# Patient Record
Sex: Female | Born: 1980 | Race: White | Hispanic: No | Marital: Married | Smoking: Never smoker
Health system: Southern US, Community
[De-identification: ages and names within clinical notes are randomized; demographics above are authoritative.]

## PROBLEM LIST (undated history)

## (undated) DIAGNOSIS — Z789 Other specified health status: Secondary | ICD-10-CM

## (undated) DIAGNOSIS — N83209 Unspecified ovarian cyst, unspecified side: Secondary | ICD-10-CM

## (undated) HISTORY — PX: APPENDECTOMY: SHX54

---

## 2007-02-19 ENCOUNTER — Ambulatory Visit (HOSPITAL_COMMUNITY): Admission: RE | Admit: 2007-02-19 | Discharge: 2007-02-19 | Payer: Self-pay | Admitting: Obstetrics & Gynecology

## 2007-03-04 ENCOUNTER — Ambulatory Visit (HOSPITAL_COMMUNITY): Admission: RE | Admit: 2007-03-04 | Discharge: 2007-03-04 | Payer: Self-pay | Admitting: Obstetrics and Gynecology

## 2007-08-08 ENCOUNTER — Inpatient Hospital Stay (HOSPITAL_COMMUNITY): Admission: AD | Admit: 2007-08-08 | Discharge: 2007-08-12 | Payer: Self-pay | Admitting: Obstetrics & Gynecology

## 2007-08-08 ENCOUNTER — Ambulatory Visit: Payer: Self-pay | Admitting: Obstetrics & Gynecology

## 2007-08-09 ENCOUNTER — Encounter: Payer: Self-pay | Admitting: Obstetrics & Gynecology

## 2008-02-11 ENCOUNTER — Emergency Department (HOSPITAL_COMMUNITY): Admission: EM | Admit: 2008-02-11 | Discharge: 2008-02-11 | Payer: Self-pay | Admitting: Emergency Medicine

## 2008-02-14 ENCOUNTER — Emergency Department (HOSPITAL_COMMUNITY): Admission: EM | Admit: 2008-02-14 | Discharge: 2008-02-14 | Payer: Self-pay | Admitting: Emergency Medicine

## 2008-02-23 ENCOUNTER — Emergency Department (HOSPITAL_COMMUNITY): Admission: EM | Admit: 2008-02-23 | Discharge: 2008-02-24 | Payer: Self-pay | Admitting: Emergency Medicine

## 2008-03-11 ENCOUNTER — Ambulatory Visit: Payer: Self-pay | Admitting: Obstetrics & Gynecology

## 2008-03-25 ENCOUNTER — Ambulatory Visit (HOSPITAL_COMMUNITY): Admission: RE | Admit: 2008-03-25 | Discharge: 2008-03-25 | Payer: Self-pay | Admitting: Obstetrics and Gynecology

## 2008-04-15 ENCOUNTER — Ambulatory Visit: Payer: Self-pay | Admitting: Obstetrics and Gynecology

## 2010-10-19 ENCOUNTER — Emergency Department (HOSPITAL_COMMUNITY)
Admission: EM | Admit: 2010-10-19 | Discharge: 2010-10-19 | Payer: Self-pay | Source: Home / Self Care | Admitting: Emergency Medicine

## 2010-11-19 ENCOUNTER — Encounter: Payer: Self-pay | Admitting: *Deleted

## 2011-03-13 NOTE — Op Note (Signed)
NAME:  Destiny Barnes, Destiny Barnes NO.:  [id number]   MEDICAL RECORD NO.:  [id number]          PATIENT TYPE:  INP   LOCATION:  9115                          [facility]   PHYSICIAN:  Lesly Dukes, M.D. DATE OF BIRTH:  [birth year]   DATE OF PROCEDURE:  08/08/2007  DATE OF DISCHARGE:                               OPERATIVE REPORT   PREOPERATIVE DIAGNOSIS:  A 30 year old para 0 female at term with  premature rupture of membranes with failure to progress past the  anterior lip.  Occiput posterior presentation, asynclitism,  chorioamnionitis, and severe variables, unable to tolerate Pitocin.   POSTOPERATIVE DIAGNOSIS:  35. A 30 year old para 0 female at term with premature rupture of      membranes with failure to progress past the anterior lip.  Occiput      posterior presentation, asynclitism, chorioamnionitis, and severe      variables, unable to tolerate Pitocin.  2. Left paratubal cyst.   PROCEDURE:  Primary low transverse cesarean section.   SURGEON:  Lesly Dukes, M.D.   ASSISTANT:  Marisue Ivan, MD   ANESTHESIA:  Epidural.   PATHOLOGY:  Placenta.   ESTIMATED BLOOD LOSS:  1000.   COMPLICATIONS:  None.   FINDINGS:  Viable female infant.  Apgars 9 and 9.  Vertex presentation.  Weight 8 pounds, 8 ounces.  Thin meconium.  Normal uterus and ovaries.  There is a left paratubal cyst that is not pedunculated and has  excessive vascularity that would not be easily removed at the time of  the cesarean section due to the swollen adnexa.   PROCEDURE:  After informed consent was obtained, the patient was taken  to the operating room, where epidural anesthesia was found to be  adequate.  The patient was placed in dorsal supine position with a  leftward tilt.  A Foley was already in the bladder.  A Pfannenstiel skin  incision was made at the scalpel and carried down to the fascia.  The  fascia was incised in the midline.  This incision was extended  bilaterally.   The superior and inferior aspect of the fascial incision  were grasped with Kocher clamps, tented up, and dissected off sharply  and bluntly from the underlying layers of rectus muscles.  The rectus  muscles were separated in the midline.  The peritoneum was entered  bluntly.  The bladder blade was inserted, and the bladder flap was  created.  The bladder blade was reinserted.  The uterine incision was  made in a transverse fashion in the lower uterine segment, and the head  delivered atraumatically.  The rest of the baby's body was delivered  easily, and the nose and mouth were suctioned.  The cord was clamped and  cut, and the baby was handed off to the waiting pediatrician.  Cord  blood was sent for type and screen.  The placenta was delivered manually  and had three vessel cord.  The uterus was cleared of all clots and  debris, and the uterine incision was closed with 0 Vicryl in a running  locked fashion.  A second suture of 0 Vicryl was used to reinforce the  incision.  As noted above, there was a left paratubal cyst that could  not be easily removed due to the small nature of the adnexa at this  point.  It is felt to be a very simple paratubal cyst, and the patient  was told about it and that we were not going to remove it.  The rectus  muscles and peritoneum were noted to be hemostatic.  The uterus is  hemostatic off tension.  The fascia was closed with 0 Vicryl in a  running fashion.  Good hemostasis was noted.  The subcutaneous tissues  were copiously irrigated and found to be hemostatic.  The skin was  closed with staples.  A pressure dressing was placed on the abdomen.  The patient tolerated the procedure well.  Sponge, lap, instrument, and  needle count were correct x2.  Patient went to the recovery room in  stable condition.      Lesly Dukes, M.D.  Electronically Signed     KHL/MEDQ  D:  08/09/2007  T:  08/09/2007  Job:  [id number]

## 2011-03-13 NOTE — Group Therapy Note (Signed)
NAMEDEANDRE, STANSEL NO.:  [id number]   MEDICAL RECORD NO.:  [id number]          PATIENT TYPE:  WOC   LOCATION:  [facility]                   FACILITY:  [facility]   PHYSICIAN:  Allie Bossier, MD        DATE OF BIRTH:  October 04, 1981   DATE OF SERVICE:  03/11/2008                                  CLINIC NOTE   Ms. Blankenbaker is a 30 year old married [place] gravida 1, para 1 who has a  95-month-old son delivered by C-section.  She was seen in the [facility]  emergency department for pelvic pain/right lower quadrant pain on February 11, 2008.  They did a CT scan that was that showed something and could  not exclude a hydrosalpinx.  Transvaginal sonogram was then done that  showed 2 right ovarian cysts.  They recommended a follow-up ultrasound  in 6 weeks.  Ultrasound done 6 weeks later shows a left ovarian cyst  that measures 1.6 x 2.2 cm and a right ovarian cyst that measures 3.3 x  4 cm.  These are both simple cysts.  Please note that measurements were  not given on the ultrasound done in the [facility] ER, and that the  second ultrasound done was not done at the 6-week recommended follow-up  but instead at 2 weeks.  Ms. Spofford has been on Yasmin on a daily basis  to for birth control until this week when she forgot to take 2 pills in  a row, so she has discontinued the Yasmin, and plans to restart after  her period.  Of note, we have now discussed the recommended method  following if she misses only 2 pills.   On further review of systems, she does say that she has had dyspareunia  since her C-section 7 months ago.  When she describes her right lower  quadrant pain that initially brought her to the emergency room at [facility], she describes it as occurring approximately twice a week.  She  says that it will last for less than 5 minutes and that it has certainly  responded to the Vicodin that they gave her in the emergency room.  She  has not tried any nonsteroidals or  Tylenol.  Pain does not seem to be  affecting her lifestyle.   On physical exam, her cervix appears normal.  Her uterus is normal size  and shape, mid plane and nontender.  Her adnexa are slightly tender  bilaterally but not grossly enlarged.   ASSESSMENT/PLAN:  Small bilateral ovarian cyst.  I believe that these  are physiologic and are not particularly symptomatic.  I have does  cervical cultures to workup her dyspareunia.  Recommended she restart  her Yasmin and will check an ultrasound for follow-up of these cysts.      Allie Bossier, MD     MCD/MEDQ  D:  03/11/2008  T:  03/11/2008  Job:  [id number]

## 2011-03-16 NOTE — Discharge Summary (Signed)
Destiny Barnes, CHAIDEZ NO.:  [id number]   MEDICAL RECORD NO.:  [id number]          PATIENT TYPE:  INP   LOCATION:                                [facility]   PHYSICIAN:  Lesly Dukes, M.D. DATE OF BIRTH:  26-Apr-1981   DATE OF ADMISSION:  08/08/2007  DATE OF DISCHARGE:  08/12/2007                               DISCHARGE SUMMARY   ADMISSION DIAGNOSIS:  Intrauterine pregnancy.   DISCHARGE DIAGNOSIS:  Intrauterine pregnancy, status post primary low  transverse Cesarean section secondary to failure to progress.   PROCEDURE:  Primary low transverse C section secondary to failure to  progress.   HOSPITAL COURSE:  This is a 30 year old G1, P1 at 41 weeks that was  admitted for a spontaneous rupture of membranes on 08/07/07, at 10:00  a.m. that was admitted the following day after having pain with  contractions.  The patient was immediately taken to labor and delivery  where she was started on pitocin.  The patient therein started to have  increase in purulence and placed on Ampicillin and it was also noted  that patient's monitoring showed variable decels that was treated with  decreasing the pitocin and changing positions and applying oxygen which  improved the variables. After 2 1/2 hours of adequate contractions the  patient still had an anterior lip and it was thought that the patient's  baby's head felt OP and asynclitic, but the fetal heart tones were  currently reassuring so the patient was consented for a C section and  was taken to C section.  The patient was thought to possibly have  chorioamnionitis.  The patient was placed on Ampicillin as well as  gentamicin.   The patient went to the mother/baby floor after he procedure and has  been recovering well.  The patient has been afebrile and has been off  the antibiotics since being on the floor.  The patient is to be  discharged on 10/14, postop day #3 in good condition.   LABORATORY DATA:  CBC  showed initial white blood count on 08/08/07, of  12.6, hemoglobin of 11.2 and platelets of 226.  CBC on 08/10/07, had a  white blood cell count of 15.2, hemoglobin of 9.2, and platelets of 200.   DISCHARGE CONDITION:  Good.   DISPOSITION:  Discharged to home.   MEDICATIONS:  1. Ibuprofen 600 mg orally q. 6 hours p.r.n. for pain.  2. Colace 100 mg orally b.i.d. p.r.n. for constipation.  3. Iron sulfate 325 mg 1 tablet by mouth twice day as needed for      anemia.  4. Micronor 1 tablet by mouth once daily, preferably at the same time      of day.  5. Percocet 5/325 mg 1 tablet by mouth every 4 hours as needed for      pain.  6. Prenatal vitamins 1 tablet by mouth once daily.   INSTRUCTIONS:  The patient is to avoid heavy lifting.  Have a regular  diet.  Avoid anything in the vagina for 6 weeks and to follow up with  the [facility] Department in 6 weeks.      Marisue Ivan, MD      Lesly Dukes, M.D.  Electronically Signed    KL/MEDQ  D:  08/12/2007  T:  08/12/2007  Job:  [id number]

## 2011-07-24 LAB — URINALYSIS, ROUTINE W REFLEX MICROSCOPIC
Bilirubin Urine: NEGATIVE
Bilirubin Urine: NEGATIVE
Glucose, UA: NEGATIVE
Glucose, UA: NEGATIVE
Hgb urine dipstick: NEGATIVE
Hgb urine dipstick: NEGATIVE
Nitrite: NEGATIVE
Nitrite: NEGATIVE
Specific Gravity, Urine: 1.017
Urobilinogen, UA: 0.2
Urobilinogen, UA: 0.2
pH: 7.5

## 2011-07-24 LAB — PREGNANCY, URINE: Preg Test, Ur: NEGATIVE

## 2011-07-24 LAB — CBC
Hemoglobin: 12.8
MCHC: 34.7
MCV: 91.6
RBC: 4.02
RDW: 12.8
WBC: 7.7

## 2011-07-24 LAB — BASIC METABOLIC PANEL
Calcium: 9.3
Creatinine, Ser: 0.69
GFR calc Af Amer: >60
GFR calc non Af Amer: >60

## 2011-07-24 LAB — DIFFERENTIAL
Basophils Relative: 0
Lymphs Abs: 1.4

## 2011-07-24 LAB — GC/CHLAMYDIA PROBE AMP, GENITAL: Chlamydia, DNA Probe: NEGATIVE

## 2011-08-09 LAB — CBC
HCT: 31.8 — ABNORMAL LOW
MCHC: 34.6
Platelets: 200
Platelets: 226
RDW: 13.8
RDW: 14.7 — ABNORMAL HIGH

## 2011-08-09 LAB — ABO/RH: ABO/RH(D): A POS

## 2011-08-09 LAB — TYPE AND SCREEN
ABO/RH(D): A POS
Antibody Screen: NEGATIVE

## 2011-11-13 ENCOUNTER — Other Ambulatory Visit: Payer: Self-pay | Admitting: Obstetrics and Gynecology

## 2011-11-13 ENCOUNTER — Other Ambulatory Visit: Payer: Self-pay

## 2011-11-13 LAB — OB RESULTS CONSOLE ABO/RH

## 2011-11-13 LAB — OB RESULTS CONSOLE GC/CHLAMYDIA
Chlamydia: NEGATIVE
Gonorrhea: NEGATIVE

## 2011-11-22 ENCOUNTER — Other Ambulatory Visit (HOSPITAL_COMMUNITY): Payer: Self-pay | Admitting: Obstetrics and Gynecology

## 2011-11-22 DIAGNOSIS — O289 Unspecified abnormal findings on antenatal screening of mother: Secondary | ICD-10-CM

## 2011-11-27 ENCOUNTER — Ambulatory Visit (HOSPITAL_COMMUNITY)
Admission: RE | Admit: 2011-11-27 | Discharge: 2011-11-27 | Disposition: A | Discharge status: Home / Self Care | Payer: Medicaid Other | Source: Ambulatory Visit | Attending: Obstetrics and Gynecology | Admitting: Obstetrics and Gynecology

## 2011-11-27 ENCOUNTER — Encounter (HOSPITAL_COMMUNITY): Payer: Self-pay

## 2011-11-27 ENCOUNTER — Ambulatory Visit (HOSPITAL_COMMUNITY): Admission: RE | Admit: 2011-11-27 | Payer: Medicaid Other | Source: Ambulatory Visit

## 2011-11-27 DIAGNOSIS — O358XX0 Maternal care for other (suspected) fetal abnormality and damage, not applicable or unspecified: Secondary | ICD-10-CM | POA: Insufficient documentation

## 2011-11-27 DIAGNOSIS — Z1389 Encounter for screening for other disorder: Secondary | ICD-10-CM | POA: Insufficient documentation

## 2011-11-27 DIAGNOSIS — Z363 Encounter for antenatal screening for malformations: Secondary | ICD-10-CM | POA: Insufficient documentation

## 2011-11-27 DIAGNOSIS — O289 Unspecified abnormal findings on antenatal screening of mother: Secondary | ICD-10-CM | POA: Insufficient documentation

## 2011-11-29 ENCOUNTER — Other Ambulatory Visit (HOSPITAL_COMMUNITY): Payer: Self-pay

## 2011-12-25 ENCOUNTER — Ambulatory Visit (HOSPITAL_COMMUNITY): Payer: Medicaid Other

## 2011-12-29 ENCOUNTER — Encounter (HOSPITAL_COMMUNITY): Payer: Self-pay | Admitting: Emergency Medicine

## 2011-12-29 ENCOUNTER — Emergency Department (HOSPITAL_COMMUNITY): Payer: Medicaid Other

## 2011-12-29 ENCOUNTER — Inpatient Hospital Stay (HOSPITAL_COMMUNITY)
Admission: EM | Admit: 2011-12-29 | Discharge: 2011-12-31 | DRG: 781 | Disposition: A | Discharge status: Home / Self Care | Payer: Medicaid Other | Attending: General Surgery | Admitting: General Surgery

## 2011-12-29 DIAGNOSIS — Z3201 Encounter for pregnancy test, result positive: Secondary | ICD-10-CM

## 2011-12-29 DIAGNOSIS — O26899 Other specified pregnancy related conditions, unspecified trimester: Secondary | ICD-10-CM

## 2011-12-29 DIAGNOSIS — K3580 Unspecified acute appendicitis: Secondary | ICD-10-CM

## 2011-12-29 DIAGNOSIS — N83209 Unspecified ovarian cyst, unspecified side: Secondary | ICD-10-CM | POA: Insufficient documentation

## 2011-12-29 DIAGNOSIS — O99891 Other specified diseases and conditions complicating pregnancy: Principal | ICD-10-CM | POA: Diagnosis present

## 2011-12-29 DIAGNOSIS — K37 Unspecified appendicitis: Secondary | ICD-10-CM

## 2011-12-29 DIAGNOSIS — E669 Obesity, unspecified: Secondary | ICD-10-CM | POA: Diagnosis present

## 2011-12-29 DIAGNOSIS — Z79899 Other long term (current) drug therapy: Secondary | ICD-10-CM

## 2011-12-29 HISTORY — DX: Unspecified ovarian cyst, unspecified side: N83.209

## 2011-12-29 LAB — URINALYSIS, ROUTINE W REFLEX MICROSCOPIC
Glucose, UA: NEGATIVE mg/dL
Hgb urine dipstick: NEGATIVE
Protein, ur: NEGATIVE mg/dL
Specific Gravity, Urine: 1.027 (ref 1.005–1.030)

## 2011-12-29 LAB — DIFFERENTIAL
Basophils Absolute: 0 10*3/uL (ref 0.0–0.1)
Lymphs Abs: 1.4 10*3/uL (ref 0.7–4.0)
Monocytes Relative: 4 % (ref 3–12)
Neutro Abs: 13.9 10*3/uL — ABNORMAL HIGH (ref 1.7–7.7)

## 2011-12-29 LAB — COMPREHENSIVE METABOLIC PANEL
ALT: 16 U/L (ref 0–35)
AST: 20 U/L (ref 0–37)
Albumin: 3.3 g/dL — ABNORMAL LOW (ref 3.5–5.2)
Alkaline Phosphatase: 65 U/L (ref 39–117)
BUN: 13 mg/dL (ref 6–23)
Potassium: 3.6 mEq/L (ref 3.5–5.1)
Sodium: 137 mEq/L (ref 135–145)
Total Protein: 7.1 g/dL (ref 6.0–8.3)

## 2011-12-29 LAB — CBC
HCT: 32.6 % — ABNORMAL LOW (ref 36.0–46.0)
MCH: 32.8 pg (ref 26.0–34.0)
MCV: 95.6 fL (ref 78.0–100.0)
RDW: 13 % (ref 11.5–15.5)
WBC: 15.9 10*3/uL — ABNORMAL HIGH (ref 4.0–10.5)

## 2011-12-29 LAB — WET PREP, GENITAL
Trich, Wet Prep: NONE SEEN
Yeast Wet Prep HPF POC: NONE SEEN

## 2011-12-29 LAB — POCT PREGNANCY, URINE: Preg Test, Ur: POSITIVE — AB

## 2011-12-29 MED ORDER — OXYCODONE-ACETAMINOPHEN 5-325 MG PO TABS: 1.0000 | ORAL_TABLET | Freq: Once | ORAL | Status: DC

## 2011-12-29 MED ORDER — ONDANSETRON 4 MG PO TBDP: 8.0000 mg | ORAL_TABLET | ORAL | Status: DC

## 2011-12-29 MED ORDER — ONDANSETRON HCL 4 MG/2ML IJ SOLN
4.0000 mg | Freq: Once | INTRAMUSCULAR | Status: AC
  Administered 2011-12-30: 4 mg via INTRAVENOUS
  Filled 2011-12-29: qty 2

## 2011-12-29 MED ORDER — MORPHINE SULFATE 4 MG/ML IJ SOLN
4.0000 mg | Freq: Once | INTRAMUSCULAR | Status: AC
  Administered 2011-12-30: 4 mg via INTRAVENOUS
  Filled 2011-12-29: qty 1

## 2011-12-29 NOTE — ED Provider Notes (Signed)
Medical screening exam performed.  Patient that is 5 months pregnant presents emergency department with chief complaint of nausea and vomiting with severe right lower quadrant pain that began today.  G2P1, previous C section. Pts  OBGYN is Dr. Dareen Piano who was seen last week, normal FHR   [place], [place] 12/31/11 [phone]

## 2011-12-29 NOTE — ED Notes (Signed)
Patient stated that she started with RLQ pain early this morning but it has gotten worse throughout the day.  Has had 1 bout of diarrhea today.  +N/V today.  At this time she is resting quietly with family at bedside

## 2011-12-29 NOTE — ED Notes (Signed)
Patient transported to Ultrasound by ED tech

## 2011-12-29 NOTE — ED Notes (Signed)
Pt states that she is [redacted] weeks pregnant and that she is having right lower abdominal pain. Pt states that her pain is sharp and wraps from outer to inner lower abdominal.

## 2011-12-29 NOTE — ED Notes (Signed)
Patient complaining of sharp lower right quadrant abdominal pain that started this afternoon; patient states that she is five months pregnant.  Patient reports nausea and vomiting; denies diarrhea.  Last emesis around 1900.

## 2011-12-29 NOTE — ED Provider Notes (Signed)
History     CSN: [id number]  Arrival date & time 12/29/11  1905   First MD Initiated Contact with Patient 12/29/11 2004      Chief Complaint  Patient presents with  . Abdominal Pain    (Consider location/radiation/quality/duration/timing/severity/associated sxs/prior treatment) HPI Patient is 5 months pregnant and presents with right lower quadrant pain. She is a G2 P1 status post C-section in 2008. This pregnancy has been uncomplicated thus far. She does have prenatal care established. She states the right lower abdominal pain began yesterday and has been constant and worsening since it began. It is associated with nausea and vomiting and low-grade fever. She did have vomiting during her first trimester of pregnancy but states this had stopped for approximately one month and then returned yesterday. She denies any vaginal bleeding or leakage of fluid. She has had a decreased appetite. Movement and palpation make the pain worse. There no other associated systemic symptoms and no other alleviating or modifying factors.  Past Medical History  Diagnosis Date  . Ovarian cyst     Past Surgical History  Procedure Date  . Cesarean section     History reviewed. No pertinent family history.  History  Substance Use Topics  . Smoking status: Never Smoker   . Smokeless tobacco: Not on file  . Alcohol Use: No    OB History    Grav Para Term Preterm Abortions TAB SAB Ect Mult Living   2 1 1  0 0 0 0 0 0 1      Review of Systems ROS reviewed and otherwise negative except for mentioned in HPI  Allergies  Review of patient's allergies indicates no known allergies.  Home Medications   Current Outpatient Rx  Name Route Sig Dispense Refill  . PRENATAL VITAMINS PO Oral Take 1 tablet by mouth daily.       BP 97/61  Pulse 92  Temp(Src) 97.7 F (36.5 C) (Oral)  Resp 18  SpO2 97%  LMP 08/05/2011 Vitals reviewed Physical Exam Physical Examination: General appearance - alert, well  appearing, and in no distress Mental status - alert, oriented to person, place, and time Mouth - mucous membranes moist, pharynx normal without lesions Chest - clear to auscultation, no wheezes, rales or rhonchi, symmetric air entry Heart - normal rate, regular rhythm, normal S1, S2, no murmurs, rubs, clicks or gallops Abdomen - soft, gravid, fundus palpable at level of umbilicus, nondistended, no masses or organomegaly, ttp over right lower quadrant, no gaurding and no rebound Pelvic - normal external genitalia, vulva, vagina, cervix closed, no adnexal tenderness, uterus gravid and nontender Extremities - peripheral pulses normal, no pedal edema, no clubbing or cyanosis Skin - normal coloration and turgor, no rashes  ED Course  Procedures (including critical care time)  3:21 AM pt with improved pain after pain meds.  However due to RLQ tenderness, no acute abnormalities on ultrasound will need MRI to r/o appendicitis.  Pt will be able to have MRI in AM.  Will continue to observe overnight while awaiting scan.   5:49 AM pt c/o recurrence of pain- given another dose of pain meds and antiemetics. Pt has had no vomiting, continuing to have some RLQ tenderness, but no gaurding or rebound  Labs Reviewed  COMPREHENSIVE METABOLIC PANEL - Abnormal; Notable for the following:    Albumin 3.3 (*)    Total Bilirubin 0.2 (*)    All other components within normal limits  CBC - Abnormal; Notable for the following:  WBC 15.9 (*)    RBC 3.41 (*)    Hemoglobin 11.2 (*)    HCT 32.6 (*)    All other components within normal limits  DIFFERENTIAL - Abnormal; Notable for the following:    Neutrophils Relative 87 (*)    Lymphocytes Relative 9 (*)    Neutro Abs 13.9 (*)    All other components within normal limits  HCG, QUANTITATIVE, PREGNANCY - Abnormal; Notable for the following:    hCG, Beta Chain, Quant, S 40981 (*)    All other components within normal limits  URINALYSIS, ROUTINE W REFLEX  MICROSCOPIC - Abnormal; Notable for the following:    APPearance CLOUDY (*)    Ketones, ur >80 (*)    Leukocytes, UA MODERATE (*)    All other components within normal limits  WET PREP, GENITAL - Abnormal; Notable for the following:    WBC, Wet Prep HPF POC TOO NUMEROUS TO COUNT (*)    All other components within normal limits  POCT PREGNANCY, URINE - Abnormal; Notable for the following:    Preg Test, Ur POSITIVE (*)    All other components within normal limits  URINE MICROSCOPIC-ADD ON - Abnormal; Notable for the following:    Squamous Epithelial / LPF MANY (*)    Bacteria, UA MANY (*)    All other components within normal limits  GC/CHLAMYDIA PROBE AMP, GENITAL   US Abdomen Complete  12/29/2011  *RADIOLOGY REPORT*  Clinical Data:  Right lower quadrant abdominal pain, nausea and vomiting.  Diarrhea.  ABDOMINAL ULTRASOUND COMPLETE  Comparison:  CT of the abdomen and pelvis performed 02/11/2008  Findings:  Gallbladder:  The gallbladder is normal in appearance, without evidence for gallstones, gallbladder wall thickening or pericholecystic fluid.  No ultrasonographic Murphy's sign is elicited.  Common Bile Duct:  0.4 cm in diameter; within normal limits in caliber.  Liver:  Normal parenchymal echogenicity and echotexture; no focal lesions identified.  Limited Doppler evaluation demonstrates normal blood flow within the liver.  IVC:  Unremarkable in appearance; not characterized inferior to the liver due to overlying bowel gas.  Pancreas:  Although the pancreas is difficult to visualize in its entirety due to overlying bowel gas, no focal pancreatic abnormality is identified.  Spleen:  10.0 cm in length; within normal limits in size and echotexture.  Right kidney:  12.2 cm in length; normal in size, configuration and parenchymal echogenicity.  No evidence of mass or hydronephrosis.  Left kidney:  12.6 cm in length; normal in size, configuration and parenchymal echogenicity.  No evidence of mass or  hydronephrosis.  Abdominal Aorta:  Not characterized due to overlying bowel gas.  IMPRESSION: Unremarkable abdominal ultrasound; evaluation mildly suboptimal due to overlying bowel gas.  Original Report Authenticated By: Tonia Ghent, M.D.   US Ob Limited  12/29/2011  *RADIOLOGY REPORT*  Clinical Data: Lambert Mody right lower quadrant pain  LIMITED OBSTETRIC ULTRASOUND  Number of Fetuses: 1 Heart Rate: 150 bpm Movement: Yes Presentation: Cephalic Placental Location: anterior and fundal Previa: No Amniotic Fluid (Subjective): Normal  BPD: 5.0cm   21w   0d  MATERNAL FINDINGS: Cervix: Closed/ Uterus/Adnexae:  IMPRESSION: Single living intrauterine pregnancy.  No complications identified.  Recommend followup with non-emergent complete OB 14+ wk US examination for fetal biometric evaluation and anatomic survey. This could be performed at the [facility] [place].  Original Report Authenticated By: Rosealee Albee, M.D.     1. Abdominal pain complicating pregnancy       MDM  Pt  is a G2P1 presenting at approx [redacted] weeks gestation with c/o RLQ pain.  OB ultrasound showed no acute findings, FHR 150s.  Pelvic exam revealed os closed, no adnexal tenderness.  Pain may be due to round ligament pain or other pregnancy related cause, but need to r/o appendicitis in this patient.  Plan for MRI of abdomen- if no acute findings, pt to arrange for follow up with her OB/GYN        Ethelda Chick, MD 12/30/11 [phone]

## 2011-12-30 ENCOUNTER — Inpatient Hospital Stay (HOSPITAL_COMMUNITY): Payer: Medicaid Other | Admitting: Anesthesiology

## 2011-12-30 ENCOUNTER — Encounter (HOSPITAL_COMMUNITY): Payer: Self-pay | Admitting: Anesthesiology

## 2011-12-30 ENCOUNTER — Emergency Department (HOSPITAL_COMMUNITY): Payer: Medicaid Other

## 2011-12-30 ENCOUNTER — Encounter (HOSPITAL_COMMUNITY): Admission: EM | Disposition: A | Discharge status: Home / Self Care | Payer: Self-pay | Source: Home / Self Care

## 2011-12-30 DIAGNOSIS — K3580 Unspecified acute appendicitis: Secondary | ICD-10-CM

## 2011-12-30 DIAGNOSIS — Z3201 Encounter for pregnancy test, result positive: Secondary | ICD-10-CM

## 2011-12-30 HISTORY — PX: LAPAROSCOPIC APPENDECTOMY: SHX408

## 2011-12-30 LAB — SURGICAL PCR SCREEN: Staphylococcus aureus: NEGATIVE

## 2011-12-30 SURGERY — APPENDECTOMY, LAPAROSCOPIC
Anesthesia: General | Site: Abdomen | Wound class: Contaminated

## 2011-12-30 MED ORDER — MORPHINE SULFATE 4 MG/ML IJ SOLN
4.0000 mg | Freq: Once | INTRAMUSCULAR | Status: AC
  Administered 2011-12-30: 4 mg via INTRAVENOUS
  Filled 2011-12-30: qty 1

## 2011-12-30 MED ORDER — SODIUM CHLORIDE 0.9 % IR SOLN
Status: DC | PRN
  Administered 2011-12-30: 1000 mL

## 2011-12-30 MED ORDER — ONDANSETRON HCL 4 MG/2ML IJ SOLN
INTRAMUSCULAR | Status: AC
  Filled 2011-12-30: qty 2

## 2011-12-30 MED ORDER — MORPHINE SULFATE 2 MG/ML IJ SOLN: 2.0000 mg | INTRAMUSCULAR | Status: DC | PRN

## 2011-12-30 MED ORDER — KETOROLAC TROMETHAMINE 30 MG/ML IJ SOLN: 30.0000 mg | Freq: Four times a day (QID) | INTRAMUSCULAR | Status: DC

## 2011-12-30 MED ORDER — ONDANSETRON HCL 4 MG/2ML IJ SOLN: 4.0000 mg | Freq: Four times a day (QID) | INTRAMUSCULAR | Status: DC | PRN

## 2011-12-30 MED ORDER — SODIUM CHLORIDE 0.9 % IV SOLN
3.0000 g | Freq: Once | INTRAVENOUS | Status: AC
  Administered 2011-12-30: 3 g via INTRAVENOUS
  Filled 2011-12-30: qty 3

## 2011-12-30 MED ORDER — MORPHINE SULFATE 4 MG/ML IJ SOLN
INTRAMUSCULAR | Status: AC
  Filled 2011-12-30: qty 1

## 2011-12-30 MED ORDER — ONDANSETRON HCL 4 MG PO TABS: 4.0000 mg | ORAL_TABLET | Freq: Four times a day (QID) | ORAL | Status: DC | PRN

## 2011-12-30 MED ORDER — ACETAMINOPHEN 325 MG PO TABS
650.0000 mg | ORAL_TABLET | ORAL | Status: DC | PRN
  Administered 2011-12-31 (×2): 650 mg via ORAL
  Filled 2011-12-30 (×2): qty 2

## 2011-12-30 MED ORDER — SODIUM CHLORIDE 0.9 % IV SOLN
3.0000 g | Freq: Four times a day (QID) | INTRAVENOUS | Status: AC
  Filled 2011-12-30: qty 3

## 2011-12-30 MED ORDER — ONDANSETRON HCL 4 MG/2ML IJ SOLN
4.0000 mg | Freq: Once | INTRAMUSCULAR | Status: AC
  Administered 2011-12-30: 4 mg via INTRAVENOUS

## 2011-12-30 MED ORDER — KCL IN DEXTROSE-NACL 20-5-0.45 MEQ/L-%-% IV SOLN
INTRAVENOUS | Status: DC
  Administered 2011-12-31: 06:00:00 via INTRAVENOUS
  Filled 2011-12-30 (×3): qty 1000

## 2011-12-30 MED ORDER — HYDROMORPHONE HCL PF 1 MG/ML IJ SOLN: 0.2500 mg | INTRAMUSCULAR | Status: DC | PRN

## 2011-12-30 MED ORDER — MEPERIDINE HCL 25 MG/ML IJ SOLN: 6.2500 mg | INTRAMUSCULAR | Status: DC | PRN

## 2011-12-30 MED ORDER — MORPHINE SULFATE 4 MG/ML IJ SOLN
4.0000 mg | Freq: Once | INTRAMUSCULAR | Status: AC
  Administered 2011-12-30: 4 mg via INTRAVENOUS

## 2011-12-30 MED ORDER — OXYCODONE-ACETAMINOPHEN 5-325 MG PO TABS: 1.0000 | ORAL_TABLET | ORAL | Status: DC | PRN

## 2011-12-30 MED ORDER — DEXTROSE-NACL 5-0.45 % IV SOLN
INTRAVENOUS | Status: DC
  Administered 2011-12-30: 11:00:00 via INTRAVENOUS

## 2011-12-30 MED ORDER — PROMETHAZINE HCL 25 MG/ML IJ SOLN: 6.2500 mg | INTRAMUSCULAR | Status: DC | PRN

## 2011-12-30 SURGICAL SUPPLY — 45 items
APL SKNCLS STERI-STRIP NONHPOA (GAUZE/BANDAGES/DRESSINGS) ×1
APPLIER CLIP ROT 10 11.4 M/L (STAPLE)
APR CLP MED LRG 11.4X10 (STAPLE)
BAG SPEC RTRVL LRG 6X4 10 (ENDOMECHANICALS) ×1
BENZOIN TINCTURE PRP APPL 2/3 (GAUZE/BANDAGES/DRESSINGS) ×2 IMPLANT
BLADE SURG ROTATE 9660 (MISCELLANEOUS) ×1 IMPLANT
CANISTER SUCTION 2500CC (MISCELLANEOUS) ×2 IMPLANT
CHLORAPREP W/TINT 26ML (MISCELLANEOUS) ×2 IMPLANT
CLIP APPLIE ROT 10 11.4 M/L (STAPLE) IMPLANT
CLOSURE STERI STRIP 1/2 X4 (GAUZE/BANDAGES/DRESSINGS) ×1 IMPLANT
CLOTH BEACON ORANGE TIMEOUT ST (SAFETY) ×2 IMPLANT
COVER SURGICAL LIGHT HANDLE (MISCELLANEOUS) ×2 IMPLANT
CUTTER FLEX LINEAR 45M (STAPLE) ×2 IMPLANT
DECANTER SPIKE VIAL GLASS SM (MISCELLANEOUS) ×2 IMPLANT
DRAPE UTILITY 15X26 W/TAPE STR (DRAPE) ×4 IMPLANT
DRSG TEGADERM 2.38X2.75 (GAUZE/BANDAGES/DRESSINGS) ×1 IMPLANT
ELECT REM PT RETURN 9FT ADLT (ELECTROSURGICAL) ×2
ELECTRODE REM PT RTRN 9FT ADLT (ELECTROSURGICAL) ×1 IMPLANT
ENDOLOOP SUT PDS II  0 18 (SUTURE)
ENDOLOOP SUT PDS II 0 18 (SUTURE) IMPLANT
FILTER SMOKE EVAC LAPAROSHD (FILTER) ×2 IMPLANT
GAUZE SPONGE 2X2 8PLY STRL LF (GAUZE/BANDAGES/DRESSINGS) ×1 IMPLANT
GLOVE BIO SURGEON STRL SZ7 (GLOVE) ×2 IMPLANT
GLOVE BIOGEL PI IND STRL 7.5 (GLOVE) ×1 IMPLANT
GLOVE BIOGEL PI INDICATOR 7.5 (GLOVE) ×1
GOWN STRL NON-REIN LRG LVL3 (GOWN DISPOSABLE) ×6 IMPLANT
KIT BASIN OR (CUSTOM PROCEDURE TRAY) ×2 IMPLANT
KIT ROOM TURNOVER OR (KITS) ×2 IMPLANT
NS IRRIG 1000ML POUR BTL (IV SOLUTION) ×2 IMPLANT
PAD ARMBOARD 7.5X6 YLW CONV (MISCELLANEOUS) ×4 IMPLANT
POUCH SPECIMEN RETRIEVAL 10MM (ENDOMECHANICALS) ×2 IMPLANT
RELOAD STAPLE 45 3.5 BLU ETS (ENDOMECHANICALS) ×1 IMPLANT
RELOAD STAPLE TA45 3.5 REG BLU (ENDOMECHANICALS) ×2 IMPLANT
SCALPEL HARMONIC ACE (MISCELLANEOUS) ×2 IMPLANT
SET IRRIG TUBING LAPAROSCOPIC (IRRIGATION / IRRIGATOR) ×2 IMPLANT
SPECIMEN JAR SMALL (MISCELLANEOUS) ×2 IMPLANT
SPONGE GAUZE 2X2 STER 10/PKG (GAUZE/BANDAGES/DRESSINGS) ×1
SUT MNCRL AB 4-0 PS2 18 (SUTURE) ×2 IMPLANT
TOWEL OR 17X24 6PK STRL BLUE (TOWEL DISPOSABLE) ×2 IMPLANT
TOWEL OR 17X26 10 PK STRL BLUE (TOWEL DISPOSABLE) ×2 IMPLANT
TRAY FOLEY CATH 14FR (SET/KITS/TRAYS/PACK) ×2 IMPLANT
TRAY LAPAROSCOPIC (CUSTOM PROCEDURE TRAY) ×2 IMPLANT
TROCAR XCEL BLADELESS 5X75MML (TROCAR) ×4 IMPLANT
TROCAR XCEL BLUNT TIP 100MML (ENDOMECHANICALS) ×2 IMPLANT
WATER STERILE IRR 1000ML POUR (IV SOLUTION) IMPLANT

## 2011-12-30 NOTE — Progress Notes (Signed)
FHR 136 on admission to PACU

## 2011-12-30 NOTE — Anesthesia Preprocedure Evaluation (Addendum)
Anesthesia Evaluation  Patient identified by MRN, date of birth, ID band Patient awake    Reviewed: Allergy & Precautions, H&P , NPO status , Patient's Chart, lab work & pertinent test results  Airway Mallampati: I  Neck ROM: Full    Dental  (+) Teeth Intact   Pulmonary  breath sounds clear to auscultation        Cardiovascular Rhythm:Regular Rate:Normal     Neuro/Psych    GI/Hepatic   Endo/Other    Renal/GU      Musculoskeletal   Abdominal (+) + obese,   Peds  Hematology   Anesthesia Other Findings   Reproductive/Obstetrics (+) Pregnancy                          Anesthesia Physical Anesthesia Plan  ASA: I and Emergent  Anesthesia Plan: General   Post-op Pain Management:    Induction: Intravenous  Airway Management Planned: Oral ETT  Additional Equipment:   Intra-op Plan:   Post-operative Plan: Extubation in OR  Informed Consent: I have reviewed the patients History and Physical, chart, labs and discussed the procedure including the risks, benefits and alternatives for the proposed anesthesia with the patient or authorized representative who has indicated his/her understanding and acceptance.   Dental advisory given  Plan Discussed with: CRNA, Anesthesiologist and Surgeon  Anesthesia Plan Comments:        Anesthesia Quick Evaluation

## 2011-12-30 NOTE — Progress Notes (Signed)
K.Shiela Bruns,RNC-OB/RROB in to doppler pt; fhr 137-147, pt reports positive fetal movement, no vaginal bleeding or leaking of fluid, no contraction pain(just appendix pain).  RN spoke with Dr Harlon Flor about monitoring pt and MD said he just wanted fhr dopplered pre and post procedure, does not wish for pt to be on continuous monitoring, MD spoke with Dr Tenny Craw, pt's OBMD and said that the [facility] agreed with poc.  K.Allegra Grana spoke with Diane,RN-PACU and told of pt to have doppler post procedure, PACU RN will doppler post procedure, but if there is any difficulty then she will call RROB, gave pacu rn my name and number to be reached if needed.

## 2011-12-30 NOTE — ED Notes (Signed)
Called report to sandra on [phone]

## 2011-12-30 NOTE — ED Notes (Signed)
Dr Harlon Flor at bedside to eval pt

## 2011-12-30 NOTE — Preoperative (Signed)
Beta Blockers   Reason not to administer Beta Blockers:Not Applicable 

## 2011-12-30 NOTE — H&P (Signed)
Destiny Barnes is an 31 y.o. female.   Chief Complaint: RLQ pain HPI: 31 yo female who is [redacted] weeks pregnant presents with 1 day history of RLQ abdominal pain.  This pain is associated with nausea, vomiting, poor appetite.  She presented to the emergency department for evaluation.  Elevated WBC.  Ultrasound confirmed intrauterine pregnancy with fetal heart rate 150.  Abdominal ultrasound was unremarkable.  Abd/Pelvic MRI showed signs of early appendicitis with no sign of perforation or abscess.  We are consulted to see the patient.  Past Medical History  Diagnosis Date  . Ovarian cyst     Past Surgical History  Procedure Date  . Cesarean section     History reviewed. No pertinent family history. Social History:  reports that she has never smoked. She does not have any smokeless tobacco history on file. She reports that she does not drink alcohol or use illicit drugs.  Allergies: No Known Allergies  Medications Prior to Admission  Medication Dose Route Frequency Provider Last Rate Last Dose  . Ampicillin-Sulbactam (UNASYN) 3 g in sodium chloride 0.9 % 100 mL IVPB  3 g Intravenous Once Meagan Hunt, MD      . dextrose 5 %-0.45 % sodium chloride infusion   Intravenous Continuous Meagan Hunt, MD      . morphine 4 MG/ML injection 4 mg  4 mg Intravenous Once Ethelda Chick, MD   4 mg at 12/30/11 0012  . morphine 4 MG/ML injection 4 mg  4 mg Intravenous Once Ethelda Chick, MD   4 mg at 12/30/11 0444  . morphine 4 MG/ML injection 4 mg  4 mg Intravenous Once Cyndra Numbers, MD   4 mg at 12/30/11 0927  . morphine 4 MG/ML injection           . ondansetron (ZOFRAN) 4 MG/2ML injection           . ondansetron (ZOFRAN) injection 4 mg  4 mg Intravenous Once Ethelda Chick, MD   4 mg at 12/30/11 0011  . ondansetron (ZOFRAN) injection 4 mg  4 mg Intravenous Once Ethelda Chick, MD   4 mg at 12/30/11 0444  . DISCONTD: ondansetron (ZOFRAN-ODT) disintegrating tablet 8 mg  8 mg Oral STAT Ethelda Chick, MD       . DISCONTD: oxyCODONE-acetaminophen (PERCOCET) 5-325 MG per tablet 1 tablet  1 tablet Oral Once Ethelda Chick, MD       Medications Prior to Admission  Medication Sig Dispense Refill  . PRENATAL VITAMINS PO Take 1 tablet by mouth daily.         Results for orders placed during the hospital encounter of 12/29/11 (from the past 48 hour(s))  COMPREHENSIVE METABOLIC PANEL     Status: Abnormal   Collection Time   12/29/11  8:47 PM      Component Value Range Comment   Sodium 137  135 - 145 (mEq/L)    Potassium 3.6  3.5 - 5.1 (mEq/L)    Chloride 104  96 - 112 (mEq/L)    CO2 23  19 - 32 (mEq/L)    Glucose, Bld 84  70 - 99 (mg/dL)    BUN 13  6 - 23 (mg/dL)    Creatinine, Ser 1.47  0.50 - 1.10 (mg/dL)    Calcium 9.7  8.4 - 10.5 (mg/dL)    Total Protein 7.1  6.0 - 8.3 (g/dL)    Albumin 3.3 (*) 3.5 - 5.2 (g/dL)    AST 20  0 - 37 (U/L)    ALT 16  0 - 35 (U/L)    Alkaline Phosphatase 65  39 - 117 (U/L)    Total Bilirubin 0.2 (*) 0.3 - 1.2 (mg/dL)    GFR calc non Af Amer >90  >90 (mL/min)    GFR calc Af Amer >90  >90 (mL/min)   CBC     Status: Abnormal   Collection Time   12/29/11  8:47 PM      Component Value Range Comment   WBC 15.9 (*) 4.0 - 10.5 (K/uL)    RBC 3.41 (*) 3.87 - 5.11 (MIL/uL)    Hemoglobin 11.2 (*) 12.0 - 15.0 (g/dL)    HCT 16.1 (*) 09.6 - 46.0 (%)    MCV 95.6  78.0 - 100.0 (fL)    MCH 32.8  26.0 - 34.0 (pg)    MCHC 34.4  30.0 - 36.0 (g/dL)    RDW 04.5  40.9 - 81.1 (%)    Platelets 180  150 - 400 (K/uL)   DIFFERENTIAL     Status: Abnormal   Collection Time   12/29/11  8:47 PM      Component Value Range Comment   Neutrophils Relative 87 (*) 43 - 77 (%)    Lymphocytes Relative 9 (*) 12 - 46 (%)    Monocytes Relative 4  3 - 12 (%)    Eosinophils Relative 0  0 - 5 (%)    Basophils Relative 0  0 - 1 (%)    Neutro Abs 13.9 (*) 1.7 - 7.7 (K/uL)    Lymphs Abs 1.4  0.7 - 4.0 (K/uL)    Monocytes Absolute 0.6  0.1 - 1.0 (K/uL)    Eosinophils Absolute 0.0  0.0 - 0.7 (K/uL)      Basophils Absolute 0.0  0.0 - 0.1 (K/uL)    RBC Morphology POLYCHROMASIA PRESENT     HCG, QUANTITATIVE, PREGNANCY     Status: Abnormal   Collection Time   12/29/11  8:48 PM      Component Value Range Comment   hCG, Beta Chain, Quant, S 16273 (*) <5 (mIU/mL)   URINALYSIS, ROUTINE W REFLEX MICROSCOPIC     Status: Abnormal   Collection Time   12/29/11  8:49 PM      Component Value Range Comment   Color, Urine YELLOW  YELLOW     APPearance CLOUDY (*) CLEAR     Specific Gravity, Urine 1.027  1.005 - 1.030     pH 6.5  5.0 - 8.0     Glucose, UA NEGATIVE  NEGATIVE (mg/dL)    Hgb urine dipstick NEGATIVE  NEGATIVE     Bilirubin Urine NEGATIVE  NEGATIVE     Ketones, ur >80 (*) NEGATIVE (mg/dL)    Protein, ur NEGATIVE  NEGATIVE (mg/dL)    Urobilinogen, UA 0.2  0.0 - 1.0 (mg/dL)    Nitrite NEGATIVE  NEGATIVE     Leukocytes, UA MODERATE (*) NEGATIVE    URINE MICROSCOPIC-ADD ON     Status: Abnormal   Collection Time   12/29/11  8:49 PM      Component Value Range Comment   Squamous Epithelial / LPF MANY (*) RARE     WBC, UA 3-6  <3 (WBC/hpf)    RBC / HPF 0-2  <3 (RBC/hpf)    Bacteria, UA MANY (*) RARE     Urine-Other MUCOUS PRESENT     POCT PREGNANCY, URINE     Status: Abnormal   Collection Time  12/29/11  8:56 PM      Component Value Range Comment   Preg Test, Ur POSITIVE (*) NEGATIVE    WET PREP, GENITAL     Status: Abnormal   Collection Time   12/29/11 11:20 PM      Component Value Range Comment   Yeast Wet Prep HPF POC NONE SEEN  NONE SEEN     Trich, Wet Prep NONE SEEN  NONE SEEN     Clue Cells Wet Prep HPF POC NONE SEEN  NONE SEEN     WBC, Wet Prep HPF POC TOO NUMEROUS TO COUNT (*) NONE SEEN     Mr Pelvis Wo Contrast  12/30/2011  *RADIOLOGY REPORT*  Clinical Data:  Abdominal pain in patient [redacted] weeks pregnant. 7.  Right lower quadrant pain.  Low grade fever and leukocytosis.  MRI ABDOMEN AND PELVIS WITHOUT CONTRAST  Technique:  Multiplanar multisequence MR imaging of the abdomen and  pelvis was performed.  No intravenous contrast was administered.  Comparison:  The abdominal ultrasound 12/29/2011  MRI ABDOMEN  Findings:  The liver and gallbladder are normal.  There is mild hydronephrosis of the right  kidney and proximal hydroureter.  This is felt to be secondary to  physiologic obstruction by the enlarged uterus.  No calculi identified.  The left kidney demonstrates no hydronephrosis.  The pancreas and spleen appear normal.  IMPRESSION: Hydronephrosis of pregnancy and involving the right kidney.  Normal gallbladder  MRI PELVIS  Findings: The appendix extends ventral and cephalad from the cecal tip.  Appendix measures  9 mm in diameter (image 16, series 4). There is a thin rim of fluid along the margin of the appendix (coronal image 17, series 3).  There is inflammation surrounding the tip the appendix (image 16, series 5, image 8 series 8).  These findings are concerning for an early acute appendicitis.  Additionally, there is fluid along the right pericolic gutter extending from the inferior margin of the right hepatic lobe and down to the right adnexal region. The terminal ileum is seen in expected location just cephalad to the appendix.  The gravid uterus is noted.  The placenta appears well away from the cervix.  The bladder is normal.  IMPRESSION:   Mildly enlarged appendix with periappendiceal fluid  and inflammation coupled with fluid along the right pericolic gutter is concerning for early acute appendicitis.  Findings conveyed to Dr. Alto Denver on 12/30/2011 at  9:50 am  Original Report Authenticated By: Genevive Bi, M.D.   Mr Abdomen Wo Contrast  12/30/2011  *RADIOLOGY REPORT*  Clinical Data:  Abdominal pain in patient [redacted] weeks pregnant. 7.  Right lower quadrant pain.  Low grade fever and leukocytosis.  MRI ABDOMEN AND PELVIS WITHOUT CONTRAST  Technique:  Multiplanar multisequence MR imaging of the abdomen and pelvis was performed.  No intravenous contrast was administered.   Comparison:  The abdominal ultrasound 12/29/2011  MRI ABDOMEN  Findings:  The liver and gallbladder are normal.  There is mild hydronephrosis of the right  kidney and proximal hydroureter.  This is felt to be secondary to  physiologic obstruction by the enlarged uterus.  No calculi identified.  The left kidney demonstrates no hydronephrosis.  The pancreas and spleen appear normal.  IMPRESSION: Hydronephrosis of pregnancy and involving the right kidney.  Normal gallbladder  MRI PELVIS  Findings: The appendix extends ventral and cephalad from the cecal tip.  Appendix measures  9 mm in diameter (image 16, series 4). There is a thin rim of  fluid along the margin of the appendix (coronal image 17, series 3).  There is inflammation surrounding the tip the appendix (image 16, series 5, image 8 series 8).  These findings are concerning for an early acute appendicitis.  Additionally, there is fluid along the right pericolic gutter extending from the inferior margin of the right hepatic lobe and down to the right adnexal region. The terminal ileum is seen in expected location just cephalad to the appendix.  The gravid uterus is noted.  The placenta appears well away from the cervix.  The bladder is normal.  IMPRESSION:   Mildly enlarged appendix with periappendiceal fluid  and inflammation coupled with fluid along the right pericolic gutter is concerning for early acute appendicitis.  Findings conveyed to Dr. Alto Denver on 12/30/2011 at  9:50 am  Original Report Authenticated By: Genevive Bi, M.D.   US Abdomen Complete  12/29/2011  *RADIOLOGY REPORT*  Clinical Data:  Right lower quadrant abdominal pain, nausea and vomiting.  Diarrhea.  ABDOMINAL ULTRASOUND COMPLETE  Comparison:  CT of the abdomen and pelvis performed 02/11/2008  Findings:  Gallbladder:  The gallbladder is normal in appearance, without evidence for gallstones, gallbladder wall thickening or pericholecystic fluid.  No ultrasonographic Murphy's sign is elicited.   Common Bile Duct:  0.4 cm in diameter; within normal limits in caliber.  Liver:  Normal parenchymal echogenicity and echotexture; no focal lesions identified.  Limited Doppler evaluation demonstrates normal blood flow within the liver.  IVC:  Unremarkable in appearance; not characterized inferior to the liver due to overlying bowel gas.  Pancreas:  Although the pancreas is difficult to visualize in its entirety due to overlying bowel gas, no focal pancreatic abnormality is identified.  Spleen:  10.0 cm in length; within normal limits in size and echotexture.  Right kidney:  12.2 cm in length; normal in size, configuration and parenchymal echogenicity.  No evidence of mass or hydronephrosis.  Left kidney:  12.6 cm in length; normal in size, configuration and parenchymal echogenicity.  No evidence of mass or hydronephrosis.  Abdominal Aorta:  Not characterized due to overlying bowel gas.  IMPRESSION: Unremarkable abdominal ultrasound; evaluation mildly suboptimal due to overlying bowel gas.  Original Report Authenticated By: Tonia Ghent, M.D.   US Ob Limited  12/29/2011  *RADIOLOGY REPORT*  Clinical Data: Lambert Mody right lower quadrant pain  LIMITED OBSTETRIC ULTRASOUND  Number of Fetuses: 1 Heart Rate: 150 bpm Movement: Yes Presentation: Cephalic Placental Location: anterior and fundal Previa: No Amniotic Fluid (Subjective): Normal  BPD: 5.0cm   21w   0d  MATERNAL FINDINGS: Cervix: Closed/ Uterus/Adnexae:  IMPRESSION: Single living intrauterine pregnancy.  No complications identified.  Recommend followup with non-emergent complete OB 14+ wk US examination for fetal biometric evaluation and anatomic survey. This could be performed at the [facility].  Original Report Authenticated By: Rosealee Albee, M.D.    Review of Systems  Gastrointestinal: Positive for nausea, vomiting and abdominal pain.    Blood pressure 91/56, pulse 82, temperature 98.3 F (36.8 C), temperature source Oral, resp.  rate 16, last menstrual period 08/05/2011, SpO2 97.00%. Physical Exam  WDWN in NAD HEENT - EOMI, sclera anicteric Lungs - CTA B CV - RRR Abd - palpable uterus; tender RLQ just below level of umbilicus; no guarding, no peritoneal signs Assessment/Plan Acute appendicitis Intrauterine pregnancy - 20 weeks  Dr. Tenny Craw (OB/GYN) is on call for Dr. Dareen Piano, her regular OB.  He has been notified.  The OB nurse will document fetal heart tones  pre-op and post-op.  The patient requires laparoscopic appendectomy to prevent rupture and the associated complications.  The surgical procedure has been discussed with the patient.  Potential risks, benefits, alternative treatments, and expected outcomes have been explained.  All of the patient's questions at this time have been answered.  The likelihood of reaching the patient's treatment goal is good.  The patient understand the proposed surgical procedure and wishes to proceed.   Saifullah Jolley K. 12/30/2011, 11:05 AM

## 2011-12-30 NOTE — Op Note (Signed)
Appendectomy, Lap, Procedure Note  Indications: The patient presented with a history of right-sided abdominal pain. The patient is [redacted] weeks pregnant.   A MRI revealed findings consistent with acute appendicitis.  Pre-operative Diagnosis: Acute appendicitis without mention of peritonitis  Post-operative Diagnosis: Same  Surgeon: Massiah Minjares K.   Assistants: None  Anesthesia: General endotracheal anesthesia  ASA Class: 2  Procedure Details  The patient was seen again in the Holding Room. The risks, benefits, complications, treatment options, and expected outcomes were discussed with the patient and/or family. The possibilities of reaction to medication, premature labor, perforation of viscus, bleeding, recurrent infection, finding a normal appendix, the need for additional procedures, failure to diagnose a condition, and creating a complication requiring transfusion or operation were discussed. There was concurrence with the proposed plan and informed consent was obtained. The site of surgery was properly noted. The patient was taken to Operating Room, identified as Destiny Barnes and the procedure verified as Appendectomy. A Time Out was held and the above information confirmed.  The patient was placed in the supine position and general anesthesia was induced.  A foley catheter was placed under sterile technique.  The abdomen was prepped and draped in a sterile fashion. A one centimeter supraumbilical incision was made.  Dissection was carried down to the fascia bluntly.  The fascia was incised vertically.  We entered the peritoneal cavity bluntly.  A pursestring suture was passed around the incision with a 0 Vicryl.  The Hasson cannula was introduced into the abdomen and the tails of the suture were used to hold the Hasson in place.   The pneumoperitoneum was then established maintaining a maximum pressure of 15 mmHg.  Additional 5 mm cannulas then placed in the upper midline of the abdomen and  the right upper quadrant under direct visualization.  This port placement was altered because of the gravid uterus. A careful evaluation of the entire abdomen was carried out. The patient was placed in Trendelenburg and left lateral decubitus position.  The scope was moved to the right upper quadrant port site. The cecum was mobilized medially.  The appendix was quickly identified.  There was no sign of perforation or purulence around the appendix.  It did seem thickened and inflamed. The appendix was carefully dissected. The appendix was was skeletonized with the harmonic scalpel.   The appendix was divided at its base using an endo-GIA stapler. Minimal appendiceal stump was left in place. The appendix was then removed via Endo-Catch sac.  We inspected the staple line which was intact with no signs of bleeding or leak. The umbilical port site was closed with the purse string suture. There was no residual palpable fascial defect.  The trocar site skin wounds were closed with 4-0 Monocryl.  Instrument, sponge, and needle counts were correct at the conclusion of the case.   Findings: The appendix was found to be inflamed. There were not signs of necrosis.  There was not perforation. There was not abscess formation.  Estimated Blood Loss:  Minimal         Drains: none         Specimens: appendix         Complications:  None; patient tolerated the procedure well.         Disposition: PACU - hemodynamically stable.         Condition: stable

## 2011-12-30 NOTE — ED Notes (Signed)
Have called ob rapid response RN

## 2011-12-30 NOTE — ED Provider Notes (Signed)
Patient is a 31 year old G2 P1 at [redacted] weeks gestation with last ventral. October 7. Her MRI returned showing an appendix measuring 9 mm with inflammation noted at the tip as well as fluid noted along this. Patient also had asymmetric fluid noted in the right paracolic gutter. Patient was reassessed by myself and continued to have right lower quadrant tenderness. Patient has been n.p.o. since yesterday. Call was placed to Gen. surgery. Maintenance IV fluids were initiated. I spoke with Dr.Tsuei with general surgery. He requested antibiotics and will admit the patient for appendectomy. He requested that OB nurse see the patient in the emergency department. They have been contacted for reassessment. Patient also had a dose of Unasyn ordered. I have placed a call to the patient's OB/GYN to also notify them of patient's presentation here.  Cyndra Numbers, MD 12/30/11 1044

## 2011-12-31 ENCOUNTER — Other Ambulatory Visit (HOSPITAL_COMMUNITY): Payer: Self-pay | Admitting: Obstetrics and Gynecology

## 2011-12-31 ENCOUNTER — Encounter (HOSPITAL_COMMUNITY): Payer: Self-pay | Admitting: Surgery

## 2011-12-31 DIAGNOSIS — O289 Unspecified abnormal findings on antenatal screening of mother: Secondary | ICD-10-CM

## 2011-12-31 LAB — GC/CHLAMYDIA PROBE AMP, GENITAL: Chlamydia, DNA Probe: NEGATIVE

## 2011-12-31 MED ORDER — OXYCODONE HCL 5 MG PO CAPS: 5.0000 mg | ORAL_CAPSULE | Freq: Four times a day (QID) | ORAL | Status: AC | PRN

## 2011-12-31 NOTE — Discharge Summary (Signed)
Physician Discharge Summary  Patient ID: Destiny Barnes MRN: [id number] DOB/AGE: 03-01-1981 31 y.o.  Admit date: 12/29/2011 Discharge date: 12/31/2011  Admission Diagnoses: Acute appendicitis Pregnancy Discharge Diagnoses:  Active Problems:  Acute appendicitis  Pregnancy   Procedures: Procedure(s): APPENDECTOMY LAPAROSCOPIC  Discharged Condition: good  Hospital Course: HPI: 31 yo female who is [redacted] weeks pregnant presents with 1 day history of RLQ abdominal pain. This pain is associated with nausea, vomiting, poor appetite. She presented to the emergency department for evaluation. Elevated WBC. Ultrasound confirmed intrauterine pregnancy with fetal heart rate 150. Abdominal ultrasound was unremarkable. Abd/Pelvic MRI showed signs of early appendicitis with no sign of perforation or abscess. We are consulted to see the patient.  After evaluation by Dr. Corliss Skains, she was admitted for appendicitis. The OB RN evaluated the pt as well for FHT, which were fione. She was taken to the OR on 3/3/ for lap appy. No intra-op or post-op issues encountered. She is voiding well and eating well. Pain control good. She is stable for Discharge on POD#1.   Consults: None  Discharge Exam: Blood pressure 91/52, pulse 86, temperature 98 F (36.7 C), temperature source Oral, resp. rate 18, height 5\' 5"  (1.651 m), weight 92.08 kg (203 lb), last menstrual period 08/05/2011, SpO2 96.00%. Lungs: CTA without w/r/r Heart: Regular Abdomen: soft, ND, appropriately tender   Incisions all c/d/i without erythema or hematoma. Ext: No edema or tenderness   Disposition: To Home  Discharge Orders    Future Appointments: Provider: Department: Dept Phone: Center:   01/01/2012 9:30 AM [facility] [place] [facility] [phone] [facility]     Future Orders Please Complete By Expires   Diet general      Increase activity slowly      May shower / Bathe      Remove dressing in 24 hours      Comments:   Leave steri-strips in  place.   Call MD for:  redness, tenderness, or signs of infection (pain, swelling, redness, odor or green/yellow discharge around incision site)      Call MD for:  severe uncontrolled pain      Call MD for:  persistant nausea and vomiting      Call MD for:  temperature >100.4        Medication List  As of 12/31/2011  9:00 AM   TAKE these medications         oxycodone 5 MG capsule   Commonly known as: OXY-IR   Take 1 capsule (5 mg total) by mouth every 6 (six) hours as needed for pain (Use for severe breakthough pain only.).      PRENATAL VITAMINS PO   Take 1 tablet by mouth daily.           Follow-up Information    Follow up with C[facility],MD, MD on 01/08/2012. (DOW clinic 3:10pm)    Contact information:   [facility] [address] [place] [address] [phone]          Signed: Alyse Low 12/31/2011, 9:00 AM

## 2011-12-31 NOTE — Discharge Instructions (Signed)
[facility] ______[facility] °LAPAROSCOPIC SURGERY: POST OP INSTRUCTIONS °Always review your discharge instruction sheet given to you by the facility where your surgery was performed. °IF YOU HAVE DISABILITY OR FAMILY LEAVE FORMS, YOU MUST BRING THEM TO THE OFFICE FOR PROCESSING.   °DO NOT GIVE THEM TO YOUR DOCTOR. ° °1. A prescription for pain medication may be given to you upon discharge.  Take your pain medication as prescribed, if needed.  If narcotic pain medicine is not needed, then you may take acetaminophen (Tylenol) or ibuprofen (Advil) as needed. °2. Take your usually prescribed medications unless otherwise directed. °3. If you need a refill on your pain medication, please contact your pharmacy.  They will contact our office to request authorization. Prescriptions will not be filled after 5pm or on week-ends. °4. You should follow a light diet the first few days after arrival home, such as soup and crackers, etc.  Be sure to include lots of fluids daily. °5. Most patients will experience some swelling and bruising in the area of the incisions.  Ice packs will help.  Swelling and bruising can take several days to resolve.  °6. It is common to experience some constipation if taking pain medication after surgery.  Increasing fluid intake and taking a stool softener (such as Colace) will usually help or prevent this problem from occurring.  A mild laxative (Milk of Magnesia or Miralax) should be taken according to package instructions if there are no bowel movements after 48 hours. °7. Unless discharge instructions indicate otherwise, you may remove your bandages 24-48 hours after surgery, and you may shower at that time.  You may have steri-strips (small skin tapes) in place directly over the incision.  These strips should be left on the skin for 7-10 days.  If your surgeon used skin glue on the incision, you may shower in 24 hours.  The glue will flake off over the next 2-3 weeks.  Any sutures or  staples will be removed at the office during your follow-up visit. °8. ACTIVITIES:  You may resume regular (light) daily activities beginning the next day--such as daily self-care, walking, climbing stairs--gradually increasing activities as tolerated.  You may have sexual intercourse when it is comfortable.  Refrain from any heavy lifting or straining until approved by your doctor. °a. You may drive when you are no longer taking prescription pain medication, you can comfortably wear a seatbelt, and you can safely maneuver your car and apply brakes. °b. RETURN TO WORK:  __________________________________________________________ °9. You should see your doctor in the office for a follow-up appointment approximately 2-3 weeks after your surgery.  Make sure that you call for this appointment within a day or two after you arrive home to insure a convenient appointment time. °10. OTHER INSTRUCTIONS: __________________________________________________________________________________________________________________________ __________________________________________________________________________________________________________________________ °WHEN TO CALL YOUR DOCTOR: °1. Fever over 101.0 °2. Inability to urinate °3. Continued bleeding from incision. °4. Increased pain, redness, or drainage from the incision. °5. Increasing abdominal pain ° °The clinic staff is available to answer your questions during regular business hours.  Please don’t hesitate to call and ask to speak to one of the nurses for clinical concerns.  If you have a medical emergency, go to the nearest emergency room or call 911.  A surgeon from [facility] is always on call at the hospital. °[address], [address], [place], [place]  [address] ? [address] [address], [place], [place]   [address] °[phone] ? [phone] ? FAX [phone] °Web site:   [email or web address] °

## 2011-12-31 NOTE — Progress Notes (Signed)
UR of chart complete.  

## 2011-12-31 NOTE — Discharge Summary (Signed)
Okay to go home.  Iram Lundberg O. Joon Pohle, III, MD, FACS [phone]--pager [phone]--office [facility]  

## 2012-01-01 ENCOUNTER — Ambulatory Visit (HOSPITAL_COMMUNITY): Payer: Medicaid Other

## 2012-01-01 NOTE — ED Provider Notes (Signed)
Medical screening examination/treatment/procedure(s) were performed by non-physician practitioner and as supervising physician I was immediately available for consultation/collaboration.  Raeford Razor, MD 01/01/12 [phone]

## 2012-01-02 NOTE — Transfer of Care (Signed)
Record completed on paper

## 2012-01-02 NOTE — Anesthesia Postprocedure Evaluation (Signed)
Record completed on paper 

## 2012-01-03 MED FILL — Succinylcholine Chloride Inj 20 MG/ML: INTRAMUSCULAR | Qty: 10 | Status: AC

## 2012-01-03 MED FILL — Neostigmine Methylsulfate Inj 1 MG/ML: INTRAMUSCULAR | Qty: 5 | Status: AC

## 2012-01-03 MED FILL — Lidocaine HCl IV Inj 20 MG/ML: INTRAVENOUS | Qty: 5 | Status: AC

## 2012-01-03 MED FILL — Vecuronium Bromide For Inj 10 MG: INTRAVENOUS | Qty: 10 | Status: AC

## 2012-01-03 MED FILL — Phenylephrine HCl Inj 10 MG/ML: INTRAMUSCULAR | Qty: 2 | Status: AC

## 2012-01-03 MED FILL — Ondansetron HCl Inj 4 MG/2ML (2 MG/ML): INTRAMUSCULAR | Qty: 2 | Status: AC

## 2012-01-03 MED FILL — Dextrose Inj 5%: INTRAVENOUS | Qty: 250 | Status: AC

## 2012-01-03 MED FILL — Propofol IV Emul 10 MG/ML: INTRAVENOUS | Qty: 20 | Status: AC

## 2012-01-03 MED FILL — Fentanyl Citrate Inj 0.05 MG/ML: INTRAMUSCULAR | Qty: 4 | Status: AC

## 2012-01-03 MED FILL — Glycopyrrolate Inj 0.2 MG/ML: INTRAMUSCULAR | Qty: 3 | Status: AC

## 2012-01-04 ENCOUNTER — Telehealth (INDEPENDENT_AMBULATORY_CARE_PROVIDER_SITE_OTHER): Payer: Self-pay | Admitting: General Surgery

## 2012-01-04 NOTE — Telephone Encounter (Signed)
Per Dr Corliss Skains pt needs to contact her OB for advise.

## 2012-01-04 NOTE — Telephone Encounter (Signed)
The patients husband contacted the office stating his wife is having n/v and dizziness which just started today. She is 5 mos pregnant and s/p appendectomy on 12/30/11. I expressed to him she needs to contact her OB to advise them of vomiting. Paged Dr Corliss Skains for advised.

## 2012-01-08 ENCOUNTER — Encounter (INDEPENDENT_AMBULATORY_CARE_PROVIDER_SITE_OTHER): Payer: Medicaid Other

## 2012-01-08 ENCOUNTER — Ambulatory Visit (HOSPITAL_COMMUNITY)
Admission: RE | Admit: 2012-01-08 | Discharge: 2012-01-08 | Disposition: A | Discharge status: Home / Self Care | Payer: Medicaid Other | Source: Ambulatory Visit | Attending: Obstetrics and Gynecology | Admitting: Obstetrics and Gynecology

## 2012-01-08 DIAGNOSIS — O358XX0 Maternal care for other (suspected) fetal abnormality and damage, not applicable or unspecified: Secondary | ICD-10-CM | POA: Insufficient documentation

## 2012-01-08 DIAGNOSIS — O289 Unspecified abnormal findings on antenatal screening of mother: Secondary | ICD-10-CM | POA: Insufficient documentation

## 2012-01-08 DIAGNOSIS — Z363 Encounter for antenatal screening for malformations: Secondary | ICD-10-CM | POA: Insufficient documentation

## 2012-01-08 DIAGNOSIS — Z1389 Encounter for screening for other disorder: Secondary | ICD-10-CM | POA: Insufficient documentation

## 2012-01-15 ENCOUNTER — Encounter (INDEPENDENT_AMBULATORY_CARE_PROVIDER_SITE_OTHER): Payer: Self-pay | Admitting: Surgery

## 2012-01-15 ENCOUNTER — Ambulatory Visit (INDEPENDENT_AMBULATORY_CARE_PROVIDER_SITE_OTHER): Payer: Medicaid Other | Admitting: Surgery

## 2012-01-15 DIAGNOSIS — K3580 Unspecified acute appendicitis: Secondary | ICD-10-CM

## 2012-01-15 NOTE — Progress Notes (Signed)
S/p lap appy on 3/3 for acute appendicitis.  The patient is 5 months pregnant.  She has had a follow-up visit with her OB since surgery and the baby and mother both seem to be doing well.  Her incisions are healing with no sign of infection.  No abdominal tenderness.  She may resume full activity and follow-up PRN.  Wilmon Arms. Corliss Skains, MD, [facility]  01/15/2012 9:22 AM

## 2012-03-11 ENCOUNTER — Ambulatory Visit (HOSPITAL_COMMUNITY): Payer: Medicaid Other

## 2012-04-25 ENCOUNTER — Encounter (HOSPITAL_COMMUNITY): Payer: Self-pay | Admitting: Pharmacy Technician

## 2012-04-30 ENCOUNTER — Encounter (HOSPITAL_COMMUNITY)
Admission: RE | Admit: 2012-04-30 | Discharge: 2012-04-30 | Disposition: A | Discharge status: Home / Self Care | Payer: Medicaid Other | Source: Ambulatory Visit | Attending: Obstetrics and Gynecology | Admitting: Obstetrics and Gynecology

## 2012-04-30 ENCOUNTER — Encounter (HOSPITAL_COMMUNITY): Payer: Self-pay

## 2012-04-30 HISTORY — DX: Other specified health status: Z78.9

## 2012-04-30 LAB — RPR: RPR Ser Ql: NONREACTIVE

## 2012-04-30 LAB — SURGICAL PCR SCREEN
MRSA, PCR: NEGATIVE
Staphylococcus aureus: POSITIVE — AB

## 2012-04-30 LAB — CBC
HCT: 33.2 % — ABNORMAL LOW (ref 36.0–46.0)
Hemoglobin: 11.3 g/dL — ABNORMAL LOW (ref 12.0–15.0)
RBC: 3.42 MIL/uL — ABNORMAL LOW (ref 3.87–5.11)

## 2012-04-30 LAB — TYPE AND SCREEN
ABO/RH(D): A POS
Antibody Screen: NEGATIVE

## 2012-04-30 NOTE — Patient Instructions (Addendum)
YOUR PROCEDURE IS SCHEDULED ON:05/09/12  ENTER THROUGH THE MAIN ENTRANCE OF [facility] AT:6am  USE DESK PHONE AND DIAL [phone] TO INFORM us OF YOUR ARRIVAL  CALL [phone] IF YOU HAVE ANY QUESTIONS OR PROBLEMS PRIOR TO YOUR ARRIVAL.  REMEMBER: DO NOT EAT OR DRINK AFTER MIDNIGHT : Thursday   SPECIAL INSTRUCTIONS:none   YOU MAY BRUSH YOUR TEETH THE MORNING OF SURGERY   TAKE THESE MEDICINES THE DAY OF SURGERY WITH SIP OF WATER:none   DO NOT WEAR JEWELRY, EYE MAKEUP, LIPSTICK OR DARK FINGERNAIL POLISH DO NOT WEAR LOTIONS  DO NOT SHAVE FOR 48 HOURS PRIOR TO SURGERY  YOU WILL NOT BE ALLOWED TO DRIVE YOURSELF HOME.

## 2012-04-30 NOTE — Pre-Procedure Instructions (Signed)
Dr. Sheral Apley notified via email that this pt is high risk for [facility].

## 2012-05-08 NOTE — H&P (Signed)
31 y.o. G2P1001  Estimated Date of Delivery: 05/11/12 admitted at 39/[redacted] weeks gestation for repeat cesarean.  Prenatal Transfer Tool  Maternal Diabetes: No Genetic Screening: Abnormal:  Results: Elevated risk of Trisomy 21 Maternal Ultrasounds/Referrals: Normal Fetal Ultrasounds or other Referrals:  Referred to Materal Fetal Medicine for elevated risk of Trisomy 21, declined amnio. Maternal Substance Abuse:  No Significant Maternal Medications:  None Significant Maternal Lab Results: None Other Significant Pregnancy Complications:  None  Afebrile, VSS Heart and Lungs: No active disease Abdomen: soft, gravid, EFW AGA. Cervical exam:  Closed, vertex high  Impression: Previous C/S at term.  Unfavorable cervix.  Patient request repeat cesarean.  Plan:  Repeat C/S.

## 2012-05-09 ENCOUNTER — Inpatient Hospital Stay (HOSPITAL_COMMUNITY): Payer: Medicaid Other | Admitting: Anesthesiology

## 2012-05-09 ENCOUNTER — Encounter (HOSPITAL_COMMUNITY): Payer: Self-pay | Admitting: *Deleted

## 2012-05-09 ENCOUNTER — Encounter (HOSPITAL_COMMUNITY): Payer: Self-pay | Admitting: Anesthesiology

## 2012-05-09 ENCOUNTER — Inpatient Hospital Stay (HOSPITAL_COMMUNITY)
Admission: RE | Admit: 2012-05-09 | Discharge: 2012-05-11 | DRG: 766 | Disposition: A | Discharge status: Home / Self Care | Payer: Medicaid Other | Source: Ambulatory Visit | Attending: Obstetrics & Gynecology | Admitting: Obstetrics & Gynecology

## 2012-05-09 ENCOUNTER — Encounter (HOSPITAL_COMMUNITY)
Admission: RE | Disposition: A | Discharge status: Home / Self Care | Payer: Self-pay | Source: Ambulatory Visit | Attending: Obstetrics & Gynecology

## 2012-05-09 DIAGNOSIS — O34219 Maternal care for unspecified type scar from previous cesarean delivery: Principal | ICD-10-CM | POA: Diagnosis present

## 2012-05-09 LAB — PREPARE RBC (CROSSMATCH)

## 2012-05-09 SURGERY — Surgical Case
Anesthesia: Spinal | Site: Abdomen | Wound class: Clean Contaminated

## 2012-05-09 MED ORDER — BUPIVACAINE IN DEXTROSE 0.75-8.25 % IT SOLN
INTRATHECAL | Status: DC | PRN
  Administered 2012-05-09: 1.6 mL via INTRATHECAL

## 2012-05-09 MED ORDER — PHENYLEPHRINE 40 MCG/ML (10ML) SYRINGE FOR IV PUSH (FOR BLOOD PRESSURE SUPPORT)
PREFILLED_SYRINGE | INTRAVENOUS | Status: AC
  Filled 2012-05-09: qty 5

## 2012-05-09 MED ORDER — CEFAZOLIN SODIUM-DEXTROSE 2-3 GM-% IV SOLR
INTRAVENOUS | Status: AC
  Filled 2012-05-09: qty 50

## 2012-05-09 MED ORDER — OXYTOCIN 40 UNITS IN LACTATED RINGERS INFUSION - SIMPLE MED: 62.5000 mL/h | INTRAVENOUS | Status: AC

## 2012-05-09 MED ORDER — ACETAMINOPHEN 10 MG/ML IV SOLN: 1000.0000 mg | Freq: Four times a day (QID) | INTRAVENOUS | Status: AC | PRN

## 2012-05-09 MED ORDER — ACETAMINOPHEN 325 MG PO TABS: 325.0000 mg | ORAL_TABLET | ORAL | Status: DC | PRN

## 2012-05-09 MED ORDER — ONDANSETRON HCL 4 MG/2ML IJ SOLN
INTRAMUSCULAR | Status: AC
  Filled 2012-05-09: qty 2

## 2012-05-09 MED ORDER — SIMETHICONE 80 MG PO CHEW
80.0000 mg | CHEWABLE_TABLET | Freq: Three times a day (TID) | ORAL | Status: DC
  Administered 2012-05-09 – 2012-05-11 (×7): 80 mg via ORAL

## 2012-05-09 MED ORDER — PROMETHAZINE HCL 25 MG/ML IJ SOLN: 6.2500 mg | INTRAMUSCULAR | Status: DC | PRN

## 2012-05-09 MED ORDER — EPHEDRINE SULFATE 50 MG/ML IJ SOLN
INTRAMUSCULAR | Status: DC | PRN
  Administered 2012-05-09: 5 mg via INTRAVENOUS

## 2012-05-09 MED ORDER — MORPHINE SULFATE 0.5 MG/ML IJ SOLN
INTRAMUSCULAR | Status: AC
  Filled 2012-05-09: qty 10

## 2012-05-09 MED ORDER — SCOPOLAMINE 1 MG/3DAYS TD PT72: 1.0000 | MEDICATED_PATCH | Freq: Once | TRANSDERMAL | Status: DC

## 2012-05-09 MED ORDER — KETOROLAC TROMETHAMINE 30 MG/ML IJ SOLN
30.0000 mg | Freq: Four times a day (QID) | INTRAMUSCULAR | Status: AC | PRN
  Administered 2012-05-09: 30 mg via INTRAVENOUS

## 2012-05-09 MED ORDER — DIPHENHYDRAMINE HCL 50 MG/ML IJ SOLN: 25.0000 mg | INTRAMUSCULAR | Status: DC | PRN

## 2012-05-09 MED ORDER — LACTATED RINGERS IV SOLN
INTRAVENOUS | Status: DC
  Administered 2012-05-09: 125 mL/h via INTRAVENOUS

## 2012-05-09 MED ORDER — ZOLPIDEM TARTRATE 5 MG PO TABS: 5.0000 mg | ORAL_TABLET | Freq: Every evening | ORAL | Status: DC | PRN

## 2012-05-09 MED ORDER — SODIUM CHLORIDE 0.9 % IJ SOLN: 3.0000 mL | INTRAMUSCULAR | Status: DC | PRN

## 2012-05-09 MED ORDER — CEFAZOLIN SODIUM-DEXTROSE 2-3 GM-% IV SOLR: 2.0000 g | INTRAVENOUS | Status: DC

## 2012-05-09 MED ORDER — PRENATAL MULTIVITAMIN CH
1.0000 | ORAL_TABLET | Freq: Every day | ORAL | Status: DC
  Administered 2012-05-10 – 2012-05-11 (×2): 1 via ORAL
  Filled 2012-05-09 (×2): qty 1

## 2012-05-09 MED ORDER — ONDANSETRON HCL 4 MG/2ML IJ SOLN: 4.0000 mg | Freq: Three times a day (TID) | INTRAMUSCULAR | Status: DC | PRN

## 2012-05-09 MED ORDER — ONDANSETRON HCL 4 MG/2ML IJ SOLN: 4.0000 mg | INTRAMUSCULAR | Status: DC | PRN

## 2012-05-09 MED ORDER — DIPHENHYDRAMINE HCL 25 MG PO CAPS: 25.0000 mg | ORAL_CAPSULE | Freq: Four times a day (QID) | ORAL | Status: DC | PRN

## 2012-05-09 MED ORDER — LACTATED RINGERS IV SOLN
40.0000 [IU] | INTRAVENOUS | Status: DC | PRN
  Administered 2012-05-09: 40 [IU] via INTRAVENOUS

## 2012-05-09 MED ORDER — NALBUPHINE HCL 10 MG/ML IJ SOLN
5.0000 mg | INTRAMUSCULAR | Status: DC | PRN
  Administered 2012-05-09 (×2): 5 mg via SUBCUTANEOUS
  Filled 2012-05-09: qty 1

## 2012-05-09 MED ORDER — DIPHENHYDRAMINE HCL 50 MG/ML IJ SOLN: 12.5000 mg | INTRAMUSCULAR | Status: DC | PRN

## 2012-05-09 MED ORDER — FENTANYL CITRATE 0.05 MG/ML IJ SOLN
INTRAMUSCULAR | Status: AC
  Filled 2012-05-09: qty 2

## 2012-05-09 MED ORDER — WITCH HAZEL-GLYCERIN EX PADS: 1.0000 "application " | MEDICATED_PAD | CUTANEOUS | Status: DC | PRN

## 2012-05-09 MED ORDER — SENNOSIDES-DOCUSATE SODIUM 8.6-50 MG PO TABS
2.0000 | ORAL_TABLET | Freq: Every day | ORAL | Status: DC
  Administered 2012-05-09 – 2012-05-10 (×2): 2 via ORAL

## 2012-05-09 MED ORDER — SCOPOLAMINE 1 MG/3DAYS TD PT72
MEDICATED_PATCH | TRANSDERMAL | Status: AC
  Filled 2012-05-09: qty 1

## 2012-05-09 MED ORDER — NALOXONE HCL 0.4 MG/ML IJ SOLN: 0.4000 mg | INTRAMUSCULAR | Status: DC | PRN

## 2012-05-09 MED ORDER — MORPHINE SULFATE (PF) 0.5 MG/ML IJ SOLN
INTRAMUSCULAR | Status: DC | PRN
  Administered 2012-05-09: .15 mg via INTRATHECAL

## 2012-05-09 MED ORDER — PHENYLEPHRINE HCL 10 MG/ML IJ SOLN
INTRAMUSCULAR | Status: DC | PRN
  Administered 2012-05-09 (×5): 40 ug via INTRAVENOUS

## 2012-05-09 MED ORDER — LACTATED RINGERS IV SOLN
INTRAVENOUS | Status: DC | PRN
  Administered 2012-05-09 (×3): via INTRAVENOUS

## 2012-05-09 MED ORDER — NALBUPHINE SYRINGE 5 MG/0.5 ML
INJECTION | INTRAMUSCULAR | Status: AC
  Administered 2012-05-09: 5 mg via SUBCUTANEOUS
  Filled 2012-05-09: qty 0.5

## 2012-05-09 MED ORDER — LACTATED RINGERS IV SOLN
INTRAVENOUS | Status: DC
  Administered 2012-05-09: 07:00:00 via INTRAVENOUS

## 2012-05-09 MED ORDER — DIPHENHYDRAMINE HCL 25 MG PO CAPS: 25.0000 mg | ORAL_CAPSULE | ORAL | Status: DC | PRN

## 2012-05-09 MED ORDER — MEPERIDINE HCL 25 MG/ML IJ SOLN: 6.2500 mg | INTRAMUSCULAR | Status: DC | PRN

## 2012-05-09 MED ORDER — MIDAZOLAM HCL 2 MG/2ML IJ SOLN: 0.5000 mg | Freq: Once | INTRAMUSCULAR | Status: DC | PRN

## 2012-05-09 MED ORDER — DIBUCAINE 1 % RE OINT: 1.0000 "application " | TOPICAL_OINTMENT | RECTAL | Status: DC | PRN

## 2012-05-09 MED ORDER — OXYTOCIN 10 UNIT/ML IJ SOLN
INTRAMUSCULAR | Status: AC
  Filled 2012-05-09: qty 4

## 2012-05-09 MED ORDER — LANOLIN HYDROUS EX OINT: 1.0000 "application " | TOPICAL_OINTMENT | CUTANEOUS | Status: DC | PRN

## 2012-05-09 MED ORDER — TETANUS-DIPHTH-ACELL PERTUSSIS 5-2.5-18.5 LF-MCG/0.5 IM SUSP: 0.5000 mL | Freq: Once | INTRAMUSCULAR | Status: DC

## 2012-05-09 MED ORDER — FENTANYL CITRATE 0.05 MG/ML IJ SOLN: 25.0000 ug | INTRAMUSCULAR | Status: DC | PRN

## 2012-05-09 MED ORDER — IBUPROFEN 600 MG PO TABS
600.0000 mg | ORAL_TABLET | Freq: Four times a day (QID) | ORAL | Status: DC
  Administered 2012-05-10 – 2012-05-11 (×7): 600 mg via ORAL
  Filled 2012-05-09 (×7): qty 1

## 2012-05-09 MED ORDER — ONDANSETRON HCL 4 MG PO TABS: 4.0000 mg | ORAL_TABLET | ORAL | Status: DC | PRN

## 2012-05-09 MED ORDER — METOCLOPRAMIDE HCL 5 MG/ML IJ SOLN
10.0000 mg | Freq: Three times a day (TID) | INTRAMUSCULAR | Status: DC | PRN
Start: 2012-05-09 — End: 2012-05-11

## 2012-05-09 MED ORDER — ONDANSETRON HCL 4 MG/2ML IJ SOLN
INTRAMUSCULAR | Status: DC | PRN
  Administered 2012-05-09: 4 mg via INTRAVENOUS

## 2012-05-09 MED ORDER — FENTANYL CITRATE 0.05 MG/ML IJ SOLN
INTRAMUSCULAR | Status: DC | PRN
  Administered 2012-05-09: 25 ug via INTRATHECAL

## 2012-05-09 MED ORDER — OXYCODONE-ACETAMINOPHEN 5-325 MG PO TABS
1.0000 | ORAL_TABLET | ORAL | Status: DC | PRN
  Administered 2012-05-10 – 2012-05-11 (×7): 1 via ORAL
  Filled 2012-05-09 (×7): qty 1

## 2012-05-09 MED ORDER — NALBUPHINE HCL 10 MG/ML IJ SOLN
5.0000 mg | INTRAMUSCULAR | Status: DC | PRN
  Filled 2012-05-09: qty 1

## 2012-05-09 MED ORDER — KETOROLAC TROMETHAMINE 30 MG/ML IJ SOLN
INTRAMUSCULAR | Status: AC
  Administered 2012-05-09: 30 mg via INTRAMUSCULAR
  Filled 2012-05-09: qty 1

## 2012-05-09 MED ORDER — MENTHOL 3 MG MT LOZG: 1.0000 | LOZENGE | OROMUCOSAL | Status: DC | PRN

## 2012-05-09 MED ORDER — SODIUM CHLORIDE 0.9 % IV SOLN: 1.0000 ug/kg/h | INTRAVENOUS | Status: DC | PRN

## 2012-05-09 MED ORDER — KETOROLAC TROMETHAMINE 30 MG/ML IJ SOLN
30.0000 mg | Freq: Four times a day (QID) | INTRAMUSCULAR | Status: AC | PRN
  Administered 2012-05-09: 30 mg via INTRAMUSCULAR
  Filled 2012-05-09: qty 1

## 2012-05-09 MED ORDER — NALBUPHINE SYRINGE 5 MG/0.5 ML
INJECTION | INTRAMUSCULAR | Status: AC
  Filled 2012-05-09: qty 0.5

## 2012-05-09 SURGICAL SUPPLY — 31 items
CLOTH BEACON ORANGE TIMEOUT ST (SAFETY) ×2 IMPLANT
CONTAINER PREFILL 10% NBF 15ML (MISCELLANEOUS) IMPLANT
DRSG COVADERM 4X10 (GAUZE/BANDAGES/DRESSINGS) ×1 IMPLANT
ELECT REM PT RETURN 9FT ADLT (ELECTROSURGICAL) ×2
ELECTRODE REM PT RTRN 9FT ADLT (ELECTROSURGICAL) ×1 IMPLANT
EXTRACTOR VACUUM M CUP 4 TUBE (SUCTIONS) ×1 IMPLANT
GLOVE ECLIPSE 6.0 STRL STRAW (GLOVE) ×2 IMPLANT
GLOVE ECLIPSE 6.5 STRL STRAW (GLOVE) ×2 IMPLANT
GOWN PREVENTION PLUS LG XLONG (DISPOSABLE) ×6 IMPLANT
KIT ABG SYR 3ML LUER SLIP (SYRINGE) IMPLANT
NDL HYPO 25X5/8 SAFETYGLIDE (NEEDLE) ×1 IMPLANT
NEEDLE HYPO 25X5/8 SAFETYGLIDE (NEEDLE) ×2 IMPLANT
NS IRRIG 1000ML POUR BTL (IV SOLUTION) ×2 IMPLANT
PACK C SECTION WH (CUSTOM PROCEDURE TRAY) ×2 IMPLANT
RTRCTR C-SECT PINK 25CM LRG (MISCELLANEOUS) ×1 IMPLANT
SLEEVE SCD COMPRESS KNEE MED (MISCELLANEOUS) IMPLANT
STAPLER VISISTAT 35W (STAPLE) ×1 IMPLANT
SUT PLAIN 0 NONE (SUTURE) IMPLANT
SUT VIC AB 0 CT1 27 (SUTURE) ×6
SUT VIC AB 0 CT1 27XBRD ANBCTR (SUTURE) ×3 IMPLANT
SUT VIC AB 1 CTX 36 (SUTURE) ×4
SUT VIC AB 1 CTX36XBRD ANBCTRL (SUTURE) ×2 IMPLANT
SUT VIC AB 3-0 CT1 27 (SUTURE) ×2
SUT VIC AB 3-0 CT1 TAPERPNT 27 (SUTURE) ×1 IMPLANT
SUT VIC AB 3-0 PS2 18 (SUTURE)
SUT VIC AB 3-0 PS2 18XBRD (SUTURE) IMPLANT
SUT VIC AB 3-0 SH 27 (SUTURE)
SUT VIC AB 3-0 SH 27X BRD (SUTURE) IMPLANT
TOWEL OR 17X24 6PK STRL BLUE (TOWEL DISPOSABLE) ×4 IMPLANT
TRAY FOLEY CATH 14FR (SET/KITS/TRAYS/PACK) ×2 IMPLANT
WATER STERILE IRR 1000ML POUR (IV SOLUTION) ×2 IMPLANT

## 2012-05-09 NOTE — Transfer of Care (Signed)
Immediate Anesthesia Transfer of Care Note  Patient: Destiny Barnes  Procedure(s) Performed: Procedure(s) (LRB): CESAREAN SECTION (N/A)  Patient Location: PACU  Anesthesia Type: Spinal  Level of Consciousness: awake, alert  and oriented  Airway & Oxygen Therapy: Patient Spontanous Breathing  Post-op Assessment: Report given to PACU RN and Post -op Vital signs reviewed and stable  Post vital signs: stable  Complications: No apparent anesthesia complications

## 2012-05-09 NOTE — Anesthesia Postprocedure Evaluation (Signed)
  Anesthesia Post-op Note  Patient: Destiny Barnes  Procedure(s) Performed: Procedure(s) (LRB): CESAREAN SECTION (N/A)  Patient Location: Mother/Baby  Anesthesia Type: Spinal  Level of Consciousness: awake  Airway and Oxygen Therapy: Patient Spontanous Breathing  Post-op Pain: mild  Post-op Assessment: Patient's Cardiovascular Status Stable and Respiratory Function Stable  Post-op Vital Signs: stable  Complications: No apparent anesthesia complications

## 2012-05-09 NOTE — Addendum Note (Signed)
Addendum  created 05/09/12 1324 by Renford Dills, CRNA   Modules edited:Notes Section

## 2012-05-09 NOTE — Anesthesia Postprocedure Evaluation (Signed)
  Anesthesia Post-op Note  Patient: Destiny Barnes  Procedure(s) Performed: Procedure(s) (LRB): CESAREAN SECTION (N/A)  Patient Location: PACU  Anesthesia Type: Spinal  Level of Consciousness: awake, alert  and oriented  Airway and Oxygen Therapy: Patient Spontanous Breathing  Post-op Pain: none  Post-op Assessment: Post-op Vital signs reviewed, Patient's Cardiovascular Status Stable, Respiratory Function Stable, Patent Airway, No signs of Nausea or vomiting, Pain level controlled, No headache and No backache  Post-op Vital Signs: Reviewed and stable  Complications: No apparent anesthesia complications

## 2012-05-09 NOTE — Consult Note (Signed)
Neonatology Note:   Attendance at C-section:    I was asked to attend this repeat C/S at term. The mother is a G2P1 A pos, GBS neg with abnormal genetic screening and declined amniocentesis, followed by MFM. ROM at delivery, fluid clear. Infant vigorous with good spontaneous cry and tone. Needed only minimal bulb suctioning. Ap 9/9. Lungs clear to ausc in DR. No stigmata of Downs syndrome or other genetic defect evident. To CN to care of Pediatrician.   Deatra James, MD

## 2012-05-09 NOTE — Anesthesia Procedure Notes (Signed)
Spinal  Patient location during procedure: OR Start time: 05/09/2012 7:30 AM Staffing Anesthesiologist: Odeth Bry A. Performed by: anesthesiologist  Preanesthetic Checklist Completed: patient identified, site marked, surgical consent, pre-op evaluation, timeout performed, IV checked, risks and benefits discussed and monitors and equipment checked Spinal Block Patient position: sitting Prep: site prepped and draped and DuraPrep Patient monitoring: heart rate, cardiac monitor, continuous pulse ox and blood pressure Approach: midline Location: L3-4 Injection technique: single-shot Needle Needle type: Sprotte  Needle gauge: 24 G Needle length: 9 cm Assessment Sensory level: T4 Additional Notes Patient tolerated procedure well. Adequate sensory level.

## 2012-05-09 NOTE — Anesthesia Preprocedure Evaluation (Addendum)
Anesthesia Evaluation  Patient identified by MRN, date of birth, ID band Patient awake    Reviewed: Allergy & Precautions, H&P , NPO status , Patient's Chart, lab work & pertinent test results  Airway Mallampati: IV      Dental No notable dental hx.    Pulmonary neg pulmonary ROS,  breath sounds clear to auscultation  Pulmonary exam normal       Cardiovascular Exercise Tolerance: Good negative cardio ROS  Rhythm:regular Rate:Normal     Neuro/Psych negative neurological ROS  negative psych ROS   GI/Hepatic negative GI ROS, Neg liver ROS,   Endo/Other  negative endocrine ROS  Renal/GU negative Renal ROS  negative genitourinary   Musculoskeletal   Abdominal Normal abdominal exam  (+)   Peds  Hematology negative hematology ROS (+)   Anesthesia Other Findings   Reproductive/Obstetrics (+) Pregnancy                          Anesthesia Physical Anesthesia Plan  ASA: II  Anesthesia Plan: Spinal   Post-op Pain Management:    Induction:   Airway Management Planned:   Additional Equipment:   Intra-op Plan:   Post-operative Plan:   Informed Consent: I have reviewed the patients History and Physical, chart, labs and discussed the procedure including the risks, benefits and alternatives for the proposed anesthesia with the patient or authorized representative who has indicated his/her understanding and acceptance.     Plan Discussed with: Anesthesiologist, CRNA and Surgeon  Anesthesia Plan Comments:         Anesthesia Quick Evaluation

## 2012-05-09 NOTE — Op Note (Addendum)
Patient Name: Destiny Barnes MRN: [id number]  Date of Surgery: 05/09/2012    PREOPERATIVE DIAGNOSIS: Previous Cesarean Section  POSTOPERATIVE DIAGNOSIS: Previous Cesarean Section   PROCEDURE: Low transverse cesarean section  SURGEON: Caralyn Guile. Arlyce Dice M.D.  ASSISTANT: Carrington Clamp, M.D.  ANESTHESIA: Spinal  ESTIMATED BLOOD LOSS: 600 ml  FINDINGS: Female, 8 lbs. 7 oz., Apgar 9,9; clear amniotic fluid, normal adnexa and uterus.   INDICATIONS: This is a 31 y.o.  [place] 1 who is admitted at 39/5 weeks for repeat C/S at maternal request.  PROCEDURE IN DETAIL: The patient was taken to the operating room and spinal anesthesia was placed.  She was then placed in the supine position with left lateral displacement of the uterus. The abdomen was prepped and draped in a sterile fashion and the bladder was catheterized.  A low transverse abdominal incision was made and carried down to the fascia. The fascia was opened transversely and the rectus sheath was dissected from the underlying rectus muscle. The rectus midline was identified and opened by sharp and blunt dissection. The peritoneum was opened. An Alexis retractor was placed and the lower uterine segment was identified, entered transversely by careful sharp dissection, and extended bluntly.  The infant was delivered with the aid of a vacuum extractor. The placenta was sent for cord blood collection. The uterus was bluntly curettage. The lower segment was closed with running interlocking Vicryl 1 suture. The peritoneum and rectus muscle were closed in the midline with running 3-0 Vicryl suture. The fascia was closed with running 0 Vicryl suture and the skin was closed with staples. All sponge and instrument counts were correct.  The patient tolerated the procedure well and left the operating room in good condition.

## 2012-05-09 NOTE — Progress Notes (Signed)
UR Chart review completed.  

## 2012-05-09 NOTE — Progress Notes (Signed)
I have interviewed and performed the pertinent exams on my patient to confirm that there have been no significant changes in her condition since the dictation of her history and physical exam.  

## 2012-05-10 LAB — CBC
Hemoglobin: 10 g/dL — ABNORMAL LOW (ref 12.0–15.0)
RBC: 3.05 MIL/uL — ABNORMAL LOW (ref 3.87–5.11)

## 2012-05-10 LAB — CCBB MATERNAL DONOR DRAW

## 2012-05-10 MED ORDER — SIMETHICONE 80 MG PO CHEW: 80.0000 mg | CHEWABLE_TABLET | ORAL | Status: DC | PRN

## 2012-05-10 NOTE — Progress Notes (Signed)
Patient is eating, ambulating, voiding.  Pain control is good. Appropriate lochia.  No complaints.  No flatus.  Filed Vitals:   05/09/12 2104 05/09/12 2310 05/10/12 0029 05/10/12 0414  BP: 90/57  89/55 90/57  Pulse: 76  73 76  Temp: 98.7 F (37.1 C)  98 F (36.7 C) 98.4 F (36.9 C)  TempSrc: Oral  Oral Oral  Resp:      SpO2: 95% 95% 94% 96%    Fundus firm Inc: c/d/i Ext: no CT  Lab Results  Component Value Date   WBC 9.3 05/10/2012   HGB 10.0* 05/10/2012   HCT 29.9* 05/10/2012   MCV 98.0 05/10/2012   PLT 173 05/10/2012    --/--/A POS (07/12 1610)  A/P Post op day #1 Repeat c/s  Routine care.    Philip Aspen

## 2012-05-11 MED ORDER — IBUPROFEN 600 MG PO TABS: 600.0000 mg | ORAL_TABLET | Freq: Four times a day (QID) | ORAL | Status: AC

## 2012-05-11 MED ORDER — OXYCODONE-ACETAMINOPHEN 5-325 MG PO TABS: 1.0000 | ORAL_TABLET | ORAL | Status: AC | PRN

## 2012-05-11 NOTE — Discharge Summary (Signed)
Obstetric Discharge Summary Reason for Admission: cesarean section Prenatal Procedures: none Intrapartum Procedures: cesarean: low cervical, transverse Postpartum Procedures: none Complications-Operative and Postpartum: none Hemoglobin  Date Value Range Status  05/10/2012 10.0* 12.0 - 15.0 g/dL Final     HCT  Date Value Range Status  05/10/2012 29.9* 36.0 - 46.0 % Final    Physical Exam:  General: alert and cooperative Lochia: appropriate Uterine Fundus: firm Incision: healing well, no significant drainage, no dehiscence, no significant erythema DVT Evaluation: No evidence of DVT seen on physical exam.  Discharge Diagnoses: Term Pregnancy-delivered  Discharge Information: Date: 05/11/2012 Activity: pelvic rest Diet: routine Medications: PNV, Ibuprofen and Percocet Condition: stable Instructions: refer to practice specific booklet Discharge to: home Follow-up Information    Follow up with Mickel Baas, MD in 4 weeks.   Contact information:   [address] [place] [address] [phone]          Newborn Data: Live born female  Birth Weight: 8 lb 6.8 oz (3820 g) APGAR: 9, 9  Home with mother.  Philip Aspen 05/11/2012, 11:31 AM

## 2012-05-12 ENCOUNTER — Encounter (HOSPITAL_COMMUNITY): Payer: Self-pay | Admitting: Obstetrics & Gynecology

## 2012-05-12 LAB — TYPE AND SCREEN
ABO/RH(D): A POS
Antibody Screen: NEGATIVE
Unit division: 0

## 2012-05-23 ENCOUNTER — Emergency Department (HOSPITAL_COMMUNITY)
Admission: EM | Admit: 2012-05-23 | Discharge: 2012-05-24 | Disposition: A | Discharge status: Home / Self Care | Payer: Medicaid Other | Attending: Emergency Medicine | Admitting: Emergency Medicine

## 2012-05-23 ENCOUNTER — Emergency Department (HOSPITAL_COMMUNITY): Payer: Medicaid Other

## 2012-05-23 ENCOUNTER — Encounter (HOSPITAL_COMMUNITY): Payer: Self-pay | Admitting: Emergency Medicine

## 2012-05-23 DIAGNOSIS — R0989 Other specified symptoms and signs involving the circulatory and respiratory systems: Secondary | ICD-10-CM | POA: Insufficient documentation

## 2012-05-23 DIAGNOSIS — R079 Chest pain, unspecified: Secondary | ICD-10-CM | POA: Insufficient documentation

## 2012-05-23 DIAGNOSIS — R0609 Other forms of dyspnea: Secondary | ICD-10-CM | POA: Insufficient documentation

## 2012-05-23 DIAGNOSIS — R0600 Dyspnea, unspecified: Secondary | ICD-10-CM

## 2012-05-23 LAB — CBC WITH DIFFERENTIAL/PLATELET
Basophils Absolute: 0.1 10*3/uL (ref 0.0–0.1)
Basophils Relative: 1 % (ref 0–1)
HCT: 36 % (ref 36.0–46.0)
Hemoglobin: 12.4 g/dL (ref 12.0–15.0)
Lymphocytes Relative: 54 % — ABNORMAL HIGH (ref 12–46)
MCHC: 34.4 g/dL (ref 30.0–36.0)
Monocytes Absolute: 0.4 10*3/uL (ref 0.1–1.0)
Monocytes Relative: 6 % (ref 3–12)
Neutro Abs: 2.9 10*3/uL (ref 1.7–7.7)
Neutrophils Relative %: 38 % — ABNORMAL LOW (ref 43–77)
WBC: 7.6 10*3/uL (ref 4.0–10.5)

## 2012-05-23 LAB — BASIC METABOLIC PANEL
CO2: 21 mEq/L (ref 19–32)
Chloride: 104 mEq/L (ref 96–112)
GFR calc Af Amer: >90 mL/min (ref >90)
Potassium: 3.7 mEq/L (ref 3.5–5.1)

## 2012-05-23 MED ORDER — SODIUM CHLORIDE 0.9 % IV SOLN
Freq: Once | INTRAVENOUS | Status: AC
  Administered 2012-05-23: 23:00:00 via INTRAVENOUS

## 2012-05-23 NOTE — ED Notes (Signed)
Patient transported to CT 

## 2012-05-23 NOTE — ED Notes (Signed)
Pt states she had c-section 2 weeks ago.  Reports stabbing pain across chest and sob x 2 days.

## 2012-05-24 MED ORDER — IOHEXOL 350 MG/ML SOLN
100.0000 mL | Freq: Once | INTRAVENOUS | Status: AC | PRN
  Administered 2012-05-24: 100 mL via INTRAVENOUS

## 2012-05-24 MED ORDER — ALBUTEROL SULFATE HFA 108 (90 BASE) MCG/ACT IN AERS
2.0000 | INHALATION_SPRAY | RESPIRATORY_TRACT | Status: DC | PRN
  Administered 2012-05-24: 2 via RESPIRATORY_TRACT
  Filled 2012-05-24: qty 6.7

## 2012-05-24 NOTE — ED Provider Notes (Signed)
Care assumed from Dr. Freida Busman. Patient with shortness of breath and stabbing pain across her chest for the last 2 days. Episodes of pain and shortness of breath last anywhere from 10 minutes to 30 minutes. Patient pending CTA  chest concern for PE given recent C-section. No clots seen on exam. Patient back to her baseline, no current chest pain or shortness of breath. Will start on albuterol for symptomatic dyspnea and have her followup with her primary care Dr.  Olivia Mackie, MD 05/24/12 [phone]

## 2012-06-05 NOTE — ED Provider Notes (Signed)
History     CSN: [id number]  Arrival date & time 05/23/12  2154   First MD Initiated Contact with Patient 05/23/12 2220      Chief Complaint  Patient presents with  . Shortness of Breath    (Consider location/radiation/quality/duration/timing/severity/associated sxs/prior treatment) The history is provided by the patient.   Patient presents with shortness of breath and sharp stabbing chest pain for the past 2 days. Patient recently had a C-section. Denies any fever or cough. No leg pain or swelling. Pain last for seconds as pleuritic. Past Medical History  Diagnosis Date  . Ovarian cyst   . No pertinent past medical history     Past Surgical History  Procedure Date  . Cesarean section   . Laparoscopic appendectomy 12/30/2011    Procedure: APPENDECTOMY LAPAROSCOPIC;  Surgeon: Wilmon Arms. Corliss Skains, MD;  Location: [facility] OR;  Service: General;  Laterality: N/A;  . Appendectomy   . Cesarean section 05/09/2012    Procedure: CESAREAN SECTION;  Surgeon: Mickel Baas, MD;  Location: [facility] ORS;  Service: Gynecology;  Laterality: N/A;  Repeat Cesarean Section    No family history on file.  History  Substance Use Topics  . Smoking status: Never Smoker   . Smokeless tobacco: Not on file  . Alcohol Use: No    OB History as of 05/24/12    Grav Para Term Preterm Abortions TAB SAB Ect Mult Living   2 2 2  0 0 0 0 0 0 2      Review of Systems  All other systems reviewed and are negative.    Allergies  Review of patient's allergies indicates no known allergies.  Home Medications   Current Outpatient Rx  Name Route Sig Dispense Refill  . IBUPROFEN 600 MG PO TABS Oral Take 600 mg by mouth every 6 (six) hours as needed. For pain    . ADULT MULTIVITAMIN W/MINERALS CH Oral Take 1 tablet by mouth daily.    . OXYCODONE-ACETAMINOPHEN 5-325 MG PO TABS Oral Take 1-2 tablets by mouth every 4 (four) hours as needed. For pain      BP 126/82  Pulse 61  Temp 98.4 F (36.9 C) (Oral)  Resp 17   SpO2 99%  LMP 08/05/2011  Physical Exam  Nursing note and vitals reviewed. Constitutional: She is oriented to person, place, and time. She appears well-developed and well-nourished.  Non-toxic appearance. No distress.  HENT:  Head: Normocephalic and atraumatic.  Eyes: Conjunctivae, EOM and lids are normal. Pupils are equal, round, and reactive to light.  Neck: Normal range of motion. Neck supple. No tracheal deviation present. No mass present.  Cardiovascular: Normal rate, regular rhythm and normal heart sounds.  Exam reveals no gallop.   No murmur heard. Pulmonary/Chest: Effort normal and breath sounds normal. No stridor. No respiratory distress. She has no decreased breath sounds. She has no wheezes. She has no rhonchi. She has no rales.  Abdominal: Soft. Normal appearance and bowel sounds are normal. She exhibits no distension. There is no tenderness. There is no rebound and no CVA tenderness.  Musculoskeletal: Normal range of motion. She exhibits no edema and no tenderness.  Neurological: She is alert and oriented to person, place, and time. She has normal strength. No cranial nerve deficit or sensory deficit. GCS eye subscore is 4. GCS verbal subscore is 5. GCS motor subscore is 6.  Skin: Skin is warm and dry. No abrasion and no rash noted.  Psychiatric: She has a normal mood and affect. Her  speech is normal and behavior is normal.    ED Course  Procedures (including critical care time)  Labs Reviewed  CBC WITH DIFFERENTIAL - Abnormal; Notable for the following:    RBC 3.77 (*)     Neutrophils Relative 38 (*)     Lymphocytes Relative 54 (*)     Lymphs Abs 4.1 (*)     All other components within normal limits  BASIC METABOLIC PANEL - Abnormal; Notable for the following:    BUN 25 (*)     All other components within normal limits  LAB REPORT - SCANNED   No results found.   1. Dyspnea   2. Chest pain       MDM  Patient signed out to Dr. Norlene Campbell and is awaiting the  results of her CT scan for evaluation of pulmonary embolism        Toy Baker, MD 06/05/12 1039

## 2013-05-06 ENCOUNTER — Ambulatory Visit: Payer: BC Managed Care – PPO | Admitting: Physician Assistant

## 2013-05-06 VITALS — BP 100/62 | HR 69 | Temp 98.1°F | Resp 16 | Ht 63.0 in | Wt 190.0 lb

## 2013-05-06 DIAGNOSIS — Z111 Encounter for screening for respiratory tuberculosis: Secondary | ICD-10-CM

## 2013-05-06 DIAGNOSIS — Z0000 Encounter for general adult medical examination without abnormal findings: Secondary | ICD-10-CM

## 2013-05-06 NOTE — Patient Instructions (Addendum)
I will let you know when your quantiferon gold test results are back.  Please return to clinic if your immunizations are not up to date - we can up date immunizations and/or draw titers to show immunity.    Health Maintenance, Females A healthy lifestyle and preventative care can promote health and wellness.  Maintain regular health, dental, and eye exams.  Eat a healthy diet. Foods like vegetables, fruits, whole grains, low-fat dairy products, and lean protein foods contain the nutrients you need without too many calories. Decrease your intake of foods high in solid fats, added sugars, and salt. Get information about a proper diet from your caregiver, if necessary.  Regular physical exercise is one of the most important things you can do for your health. Most adults should get at least 150 minutes of moderate-intensity exercise (any activity that increases your heart rate and causes you to sweat) each week. In addition, most adults need muscle-strengthening exercises on 2 or more days a week.   Maintain a healthy weight. The body mass index (BMI) is a screening tool to identify possible weight problems. It provides an estimate of body fat based on height and weight. Your caregiver can help determine your BMI, and can help you achieve or maintain a healthy weight. For adults 20 years and older:  A BMI below 18.5 is considered underweight.  A BMI of 18.5 to 24.9 is normal.  A BMI of 25 to 29.9 is considered overweight.  A BMI of 30 and above is considered obese.  Maintain normal blood lipids and cholesterol by exercising and minimizing your intake of saturated fat. Eat a balanced diet with plenty of fruits and vegetables. Blood tests for lipids and cholesterol should begin at age 43 and be repeated every 5 years. If your lipid or cholesterol levels are high, you are over 50, or you are a high risk for heart disease, you may need your cholesterol levels checked more frequently.Ongoing high  lipid and cholesterol levels should be treated with medicines if diet and exercise are not effective.  If you smoke, find out from your caregiver how to quit. If you do not use tobacco, do not start.  If you are pregnant, do not drink alcohol. If you are breastfeeding, be very cautious about drinking alcohol. If you are not pregnant and choose to drink alcohol, do not exceed 1 drink per day. One drink is considered to be 12 ounces (355 mL) of beer, 5 ounces (148 mL) of wine, or 1.5 ounces (44 mL) of liquor.  Avoid use of street drugs. Do not share needles with anyone. Ask for help if you need support or instructions about stopping the use of drugs.  High blood pressure causes heart disease and increases the risk of stroke. Blood pressure should be checked at least every 1 to 2 years. Ongoing high blood pressure should be treated with medicines, if weight loss and exercise are not effective.  If you are 45 to 34 years old, ask your caregiver if you should take aspirin to prevent strokes.  Diabetes screening involves taking a blood sample to check your fasting blood sugar level. This should be done once every 3 years, after age 59, if you are within normal weight and without risk factors for diabetes. Testing should be considered at a younger age or be carried out more frequently if you are overweight and have at least 1 risk factor for diabetes.  Breast cancer screening is essential preventative care for women. You  should practice "breast self-awareness." This means understanding the normal appearance and feel of your breasts and may include breast self-examination. Any changes detected, no matter how small, should be reported to a caregiver. Women in their 69s and 30s should have a clinical breast exam (CBE) by a caregiver as part of a regular health exam every 1 to 3 years. After age 46, women should have a CBE every year. Starting at age 100, women should consider having a mammogram (breast X-ray)  every year. Women who have a family history of breast cancer should talk to their caregiver about genetic screening. Women at a high risk of breast cancer should talk to their caregiver about having an MRI and a mammogram every year.  The Pap test is a screening test for cervical cancer. Women should have a Pap test starting at age 62. Between ages 26 and 31, Pap tests should be repeated every 2 years. Beginning at age 43, you should have a Pap test every 3 years as long as the past 3 Pap tests have been normal. If you had a hysterectomy for a problem that was not cancer or a condition that could lead to cancer, then you no longer need Pap tests. If you are between ages 58 and 55, and you have had normal Pap tests going back 10 years, you no longer need Pap tests. If you have had past treatment for cervical cancer or a condition that could lead to cancer, you need Pap tests and screening for cancer for at least 20 years after your treatment. If Pap tests have been discontinued, risk factors (such as a new sexual partner) need to be reassessed to determine if screening should be resumed. Some women have medical problems that increase the chance of getting cervical cancer. In these cases, your caregiver may recommend more frequent screening and Pap tests.  The human papillomavirus (HPV) test is an additional test that may be used for cervical cancer screening. The HPV test looks for the virus that can cause the cell changes on the cervix. The cells collected during the Pap test can be tested for HPV. The HPV test could be used to screen women aged 69 years and older, and should be used in women of any age who have unclear Pap test results. After the age of 78, women should have HPV testing at the same frequency as a Pap test.  Colorectal cancer can be detected and often prevented. Most routine colorectal cancer screening begins at the age of 58 and continues through age 49. However, your caregiver may recommend  screening at an earlier age if you have risk factors for colon cancer. On a yearly basis, your caregiver may provide home test kits to check for hidden blood in the stool. Use of a small camera at the end of a tube, to directly examine the colon (sigmoidoscopy or colonoscopy), can detect the earliest forms of colorectal cancer. Talk to your caregiver about this at age 62, when routine screening begins. Direct examination of the colon should be repeated every 5 to 10 years through age 70, unless early forms of pre-cancerous polyps or small growths are found.  Hepatitis C blood testing is recommended for all people born from [birth year] through 1965 and any individual with known risks for hepatitis C.  Practice safe sex. Use condoms and avoid high-risk sexual practices to reduce the spread of sexually transmitted infections (STIs). Sexually active women aged 47 and younger should be checked for Chlamydia, which is  a common sexually transmitted infection. Older women with new or multiple partners should also be tested for Chlamydia. Testing for other STIs is recommended if you are sexually active and at increased risk.  Osteoporosis is a disease in which the bones lose minerals and strength with aging. This can result in serious bone fractures. The risk of osteoporosis can be identified using a bone density scan. Women ages 41 and over and women at risk for fractures or osteoporosis should discuss screening with their caregivers. Ask your caregiver whether you should be taking a calcium supplement or vitamin D to reduce the rate of osteoporosis.  Menopause can be associated with physical symptoms and risks. Hormone replacement therapy is available to decrease symptoms and risks. You should talk to your caregiver about whether hormone replacement therapy is right for you.  Use sunscreen with a sun protection factor (SPF) of 30 or greater. Apply sunscreen liberally and repeatedly throughout the day. You should seek  shade when your shadow is shorter than you. Protect yourself by wearing long sleeves, pants, a wide-brimmed hat, and sunglasses year round, whenever you are outdoors.  Notify your caregiver of new moles or changes in moles, especially if there is a change in shape or color. Also notify your caregiver if a mole is larger than the size of a pencil eraser.  Stay current with your immunizations. Document Released: 04/30/2011 Document Revised: 01/07/2012 Document Reviewed: 04/30/2011 [facility] Patient Information 2014 [place], [place].

## 2013-05-06 NOTE — Progress Notes (Deleted)
  Subjective:    Patient ID: Destiny Barnes, female    DOB: 1981/10/20, 32 y.o.   MRN: [id number]  HPI    Review of Systems  Constitutional: Negative.   HENT: Negative.   Eyes: Positive for itching.  Respiratory: Negative.   Cardiovascular: Positive for leg swelling.  Gastrointestinal: Negative.   Endocrine: Negative.   Genitourinary: Negative.   Musculoskeletal: Negative.   Skin: Negative.   Allergic/Immunologic: Negative.   Neurological: Negative.   Hematological: Negative.   Psychiatric/Behavioral: Negative.        Objective:   Physical Exam        Assessment & Plan:

## 2013-05-06 NOTE — Progress Notes (Signed)
  Subjective:    Patient ID: Destiny Barnes, female    DOB: Sep 25, 1981, 32 y.o.   MRN: [id number]  HPI   Destiny Barnes is a very pleasant 32 yr old female here for CPE.  She has ppw to be completed.  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    States she feels well today.  No concerns.  Denies PMH or medication use.  Does take a multivitamin daily.  She declines labs today.  Declines pap today.  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  There are immunization requirements for [facility], unfortunately the pt has not brought immunization records with her today.  She is originally from [place].  States she has records from her home country, but did not bring them with her.  Requires MMR, hep B, varicella, tdap, flu, and TB testing.  Does not know last tetanus.  Does not think she has had chicken pox disease, unsure if she has had the vaccination.  Does not want to do titers today.    Requests quantiferon gold rather than TB test.  Ppw indicates 2 step tb is required, but states she received an email stating that quantiferon is preferred.  No hx of pos ppd.  No hx BCG.  Thinks last TB 2009.     Review of Systems  Constitutional: Negative for fever and chills.  HENT: Negative.   Respiratory: Negative.   Cardiovascular: Negative.   Gastrointestinal: Negative.   Genitourinary: Negative.   Musculoskeletal: Negative.   Skin: Negative.   Neurological: Negative.        Objective:   Physical Exam  Vitals reviewed. Constitutional: She is oriented to person, place, and time. She appears well-developed and well-nourished. No distress.  HENT:  Head: Normocephalic and atraumatic.  Eyes: Conjunctivae and EOM are normal. Pupils are equal, round, and reactive to light. No scleral icterus.  Neck: Neck supple. No thyromegaly present.  Cardiovascular: Normal rate, regular rhythm and normal heart sounds.   Pulmonary/Chest: Effort normal and breath sounds normal. She has no wheezes. She has no rales.  Abdominal: Soft. Bowel  sounds are normal. There is no tenderness.  Musculoskeletal: Normal range of motion.  Lymphadenopathy:    She has no cervical adenopathy.  Neurological: She is alert and oriented to person, place, and time.  Skin: Skin is warm and dry.  Psychiatric: She has a normal mood and affect. Her behavior is normal.        Assessment & Plan:  Routine general medical examination at a health care facility  Screening for tuberculosis - Plan: Quantiferon tb gold assay   Destiny Barnes is a pleasant 32 yr old female here for CPE and ppw completion for school.  She appears to be in good health.  Exam is WNL.  Physical exam ppw completed.  Unfortunately pt did not bring immunization records with her, so I am unable to complete that part of her ppw.  She will attach her imm record to her form.  If she needs immunizations or titers, she will need to RTC.  Pt declines labs and pap today.  Requests quantiferon gold which has been sent.

## 2013-05-08 LAB — QUANTIFERON TB GOLD ASSAY (BLOOD)
Interferon Gamma Release Assay: NEGATIVE
Mitogen value: >10 IU/mL
Quantiferon Nil Value: 0.17 IU/mL
Quantiferon Tb Ag Minus Nil Value: 0.02 IU/mL

## 2013-05-26 ENCOUNTER — Ambulatory Visit (INDEPENDENT_AMBULATORY_CARE_PROVIDER_SITE_OTHER): Payer: BC Managed Care – PPO | Admitting: Physician Assistant

## 2013-05-26 VITALS — BP 98/58 | HR 82 | Temp 98.3°F | Resp 18 | Ht 63.0 in | Wt 186.0 lb

## 2013-05-26 DIAGNOSIS — Z130 Encounter for screening for diseases of the blood and blood-forming organs and certain disorders involving the immune mechanism: Secondary | ICD-10-CM

## 2013-05-26 DIAGNOSIS — Z23 Encounter for immunization: Secondary | ICD-10-CM

## 2013-05-26 DIAGNOSIS — Z1329 Encounter for screening for other suspected endocrine disorder: Secondary | ICD-10-CM

## 2013-05-26 NOTE — Progress Notes (Signed)
   Patient ID: Destiny Barnes MRN: [id number], DOB: 1981/10/28, 32 y.o. Date of Encounter: 05/26/2013, 9:58 AM  Primary Physician: Levi Aland, MD  Chief Complaint: Immunization review  HPI: 32 y.o. female with history below presents for immunization review. Needs for [facility]. Immigrated to [place] in 2007 from [place]. Needs TDaP, hepatitis B, varicella, and MMR to matriculate into the program. She does not have any records but knows that she received a lot of vaccines as a child. She believes that she did have chicken pox as a child. Generally healthy.    Past Medical History  Diagnosis Date  . Ovarian cyst   . No pertinent past medical history      Home Meds: Prior to Admission medications   Medication Sig Start Date End Date Taking? Authorizing Provider  Multiple Vitamin (MULTIVITAMIN WITH MINERALS) TABS Take 1 tablet by mouth daily.    Historical Provider, MD    Allergies: No Known Allergies  History   Social History  . Marital Status: Married    Spouse Name: N/A    Number of Children: N/A  . Years of Education: N/A   Occupational History  . Student    Social History Main Topics  . Smoking status: Never Smoker   . Smokeless tobacco: Not on file  . Alcohol Use: No  . Drug Use: No  . Sexually Active: Yes   Other Topics Concern  . Not on file   Social History Narrative   Married. Education: [employer].     Review of Systems: Constitutional: negative for chills, fever, or fatigue  Dermatological: negative for rash Neurologic: negative for headache  Physical Exam: Blood pressure 98/58, pulse 82, temperature 98.3 F (36.8 C), temperature source Oral, resp. rate 18, height 5\' 3"  (1.6 m), weight 186 lb (84.369 kg), last menstrual period 04/30/2013, SpO2 99.00%., Body mass index is 32.96 kg/(m^2). General: Well developed, well nourished, in no acute distress. Head: Normocephalic, atraumatic, eyes without discharge, sclera non-icteric, nares are  without discharge.   Neck: Supple. Full ROM. Lungs: Clear bilaterally to auscultation without wheezes, rales, or rhonchi. Breathing is unlabored. Heart: RRR with S1 S2. No murmurs, rubs, or gallops appreciated. Msk:  Strength and tone normal for age. Extremities/Skin: Warm and dry. No clubbing or cyanosis. No edema. No rashes or suspicious lesions. Neuro: Alert and oriented X 3. Moves all extremities spontaneously. Gait is normal. CNII-XII grossly in tact. Psych:  Responds to questions appropriately with a normal affect.   Labs: MMR titer, hepatitis B titer, and varicella titer all pending  ASSESSMENT AND PLAN:  32 y.o. female here for immunization review.  -TDaP given today, copy given to patient -MMR titer pending -Varcella titer pending -Hepatitis B titer pending -Will hold vaccines pending the above labs -No form to fill out  Signed, Eula Listen, PA-C 05/26/2013 9:58 AM

## 2013-06-05 ENCOUNTER — Ambulatory Visit (INDEPENDENT_AMBULATORY_CARE_PROVIDER_SITE_OTHER): Payer: BC Managed Care – PPO | Admitting: Physician Assistant

## 2013-06-05 VITALS — Wt 190.0 lb

## 2013-06-05 DIAGNOSIS — Z23 Encounter for immunization: Secondary | ICD-10-CM

## 2013-06-05 NOTE — Progress Notes (Signed)
   Patient was found to not have immunity to hepatitis B at OV on 05/26/13. Here to start hepatitis B vaccine series. Ok to start vaccine series. This was a nursing only encounter. No provider-patient visit occurred today. Next vaccine in one month.   Results for orders placed in visit on 05/26/13  VARICELLA ZOSTER ANTIBODY, IGG      Result Value Range   Varicella IgG 633.40 (*) <135.00 Index  HEPATITIS B SURFACE ANTIBODY      Result Value Range   Hep B S Ab NEG  NEGATIVE  MEASLES/MUMPS/RUBELLA IMMUNITY      Result Value Range   Rubella 3.59 (*) <0.90 Index   Mumps IgG 145.00 (*) <9.00 AU/mL   Rubeola IgG 194.00 (*) <25.00 AU/mL     Eula Listen, PA-C 06/05/2013 11:41 AM

## 2013-07-24 ENCOUNTER — Ambulatory Visit (INDEPENDENT_AMBULATORY_CARE_PROVIDER_SITE_OTHER): Payer: BC Managed Care – PPO | Admitting: *Deleted

## 2013-07-24 DIAGNOSIS — Z23 Encounter for immunization: Secondary | ICD-10-CM

## 2013-07-24 NOTE — Progress Notes (Signed)
  Subjective:    Patient ID: Destiny Barnes, female    DOB: 06/19/1981, 32 y.o.   MRN: [id number]  HPI  Pt here for second hep b  Review of Systems     Objective:   Physical Exam        Assessment & Plan:

## 2013-09-03 ENCOUNTER — Other Ambulatory Visit: Payer: Self-pay

## 2013-09-22 ENCOUNTER — Telehealth: Payer: Self-pay

## 2013-09-22 NOTE — Telephone Encounter (Signed)
Form completed to the best of my ability based on pt's appt in July.  It is unclear to me from the form whether a UA and sickle screen are required or just recommended.  If required pt will need to RTC for this

## 2013-09-22 NOTE — Telephone Encounter (Signed)
Pt dropped off form for [employer] [facility]. I have put it in Elizabeth's box for completion.

## 2013-09-22 NOTE — Telephone Encounter (Signed)
Notified pt ready for p/up except for the UA and sickle cell. Pt stated she will p/up and check to see if they are required and will bring back to have this done if needed. Forms scanned.

## 2013-12-24 ENCOUNTER — Ambulatory Visit: Payer: BC Managed Care – PPO

## 2013-12-24 ENCOUNTER — Ambulatory Visit (INDEPENDENT_AMBULATORY_CARE_PROVIDER_SITE_OTHER): Payer: BC Managed Care – PPO | Admitting: Emergency Medicine

## 2013-12-24 VITALS — BP 100/60 | HR 76 | Temp 98.4°F | Resp 16 | Ht 63.0 in | Wt 192.0 lb

## 2013-12-24 DIAGNOSIS — M542 Cervicalgia: Secondary | ICD-10-CM

## 2013-12-24 LAB — POCT URINE PREGNANCY: Preg Test, Ur: NEGATIVE

## 2013-12-24 MED ORDER — PREDNISONE 10 MG PO TABS: ORAL_TABLET | ORAL | Status: DC

## 2013-12-24 MED ORDER — CYCLOBENZAPRINE HCL 10 MG PO TABS: ORAL_TABLET | ORAL | Status: DC

## 2013-12-24 NOTE — Progress Notes (Signed)
Subjective:    Patient ID: Destiny Barnes, female    DOB: [birth year], 33 y.o.   MRN: [id number] This chart was scribed for Collene Gobble, MD by Valera Castle, ED Scribe. This patient was seen in room 11 and the patient's care was started at 11:09 AM.  Chief Complaint  Patient presents with  . Stiff neck    x 1 week   HPI Destiny Barnes is a 33 y.o. female Pt reports constant left neck pain and stiffness that extends to her left shoulder, onset 1 week ago. She reports associated weakness, numbness to her left arm and reports intermittent lower back pain. She denies any known injury, trauma to her neck, back. She denies h/o similar pain. 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 She states sitting for long periods of time at her computer exacerbates her neck pain and left arm numbness. She denies any other symptoms. She denies being on any birth control.   PCP - Levi Aland, MD  Patient Active Problem List   Diagnosis Date Noted  . Acute appendicitis 12/30/2011  . Pregnancy test positive for other normal pregnancy 12/30/2011  . Ovarian cyst    Past Medical History  Diagnosis Date  . Ovarian cyst   . No pertinent past medical history    Past Surgical History  Procedure Laterality Date  . Cesarean section    . Laparoscopic appendectomy  12/30/2011    Procedure: APPENDECTOMY LAPAROSCOPIC;  Surgeon: Wilmon Arms. Corliss Skains, MD;  Location: [facility] OR;  Service: General;  Laterality: N/A;  . Appendectomy    . Cesarean section  05/09/2012    Procedure: CESAREAN SECTION;  Surgeon: Mickel Baas, MD;  Location: [facility] ORS;  Service: Gynecology;  Laterality: N/A;  Repeat Cesarean Section   No Known Allergies Prior to Admission medications   Medication Sig Start Date End Date Taking? Authorizing Provider  Multiple Vitamin (MULTIVITAMIN WITH MINERALS) TABS Take 1 tablet by mouth daily.    Historical Provider, MD   Review of Systems  Constitutional: Negative for fever.  Genitourinary:  Negative.   Musculoskeletal: Positive for back pain (lower), myalgias, neck pain and neck stiffness.  Neurological: Positive for weakness (left arm) and numbness (left arm).      Objective:   Physical Exam CONSTITUTIONAL: Well developed/well nourished HEAD: Normocephalic/atraumatic EYES: EOMI/PERRL ENMT: Mucous membranes moist NECK: supple no meningeal signs SPINE:entire spine nontender CV: S1/S2 noted, no murmurs/rubs/gallops noted LUNGS: Lungs are clear to auscultation bilaterally, no apparent distress ABDOMEN: soft, nontender, no rebound or guarding GU:no cva tenderness NEURO: Pt is awake/alert, moves all extremitiesx4 Reflexes and strength normal extremities. Sensation intact bilateral upper extremities.  EXTREMITIES: pulses normal, Very tender around left paracervical. Worsening pain with head turning to the right.  SKIN: warm, color normal PSYCH: no abnormalities of mood noted  BP 100/60  Pulse 76  Temp(Src) 98.4 F (36.9 C) (Oral)  Resp 16  Ht 5\' 3"  (1.6 m)  Wt 192 lb (87.091 kg)  BMI 34.02 kg/m2  SpO2 99%  LMP 12/20/2013  [facility] reading (PRIMARY) by Dr. Cleta Alberts: There is a straight C-spine.  Results for orders placed in visit on 12/24/13  POCT URINE PREGNANCY      Result Value Ref Range   Preg Test, Ur Negative         Assessment & Plan:   I suspect this is a cervical radiculopathy. We'll treat with a taper dose of prednisone along with Flexeril recheck 1 week if not better  **  Disclaimer: This note was dictated with voice recognition software. Similar sounding words can inadvertently be transcribed and this note may contain transcription errors which may not have been corrected upon publication of note.**    I personally performed the services described in this documentation, which was scribed in my presence. The recorded information has been reviewed and is accurate.

## 2013-12-24 NOTE — Patient Instructions (Signed)
Cervical Radiculopathy  Cervical radiculopathy happens when a nerve in the neck is pinched or bruised by a slipped (herniated) disk or by arthritic changes in the bones of the cervical spine. This can occur due to an injury or as part of the normal aging process. Pressure on the cervical nerves can cause pain or numbness that runs from your neck all the way down into your arm and fingers.  CAUSES   There are many possible causes, including:  · Injury.  · Muscle tightness in the neck from overuse.  · Swollen, painful joints (arthritis).  · Breakdown or degeneration in the bones and joints of the spine (spondylosis) due to aging.  · Bone spurs that may develop near the cervical nerves.  SYMPTOMS   Symptoms include pain, weakness, or numbness in the affected arm and hand. Pain can be severe or irritating. Symptoms may be worse when extending or turning the neck.  DIAGNOSIS   Your caregiver will ask about your symptoms and do a physical exam. He or she may test your strength and reflexes. X-rays, CT scans, and MRI scans may be needed in cases of injury or if the symptoms do not go away after a period of time. Electromyography (EMG) or nerve conduction testing may be done to study how your nerves and muscles are working.  TREATMENT   Your caregiver may recommend certain exercises to help relieve your symptoms. Cervical radiculopathy can, and often does, get better with time and treatment. If your problems continue, treatment options may include:  · Wearing a soft collar for short periods of time.  · Physical therapy to strengthen the neck muscles.  · Medicines, such as nonsteroidal anti-inflammatory drugs (NSAIDs), oral corticosteroids, or spinal injections.  · Surgery. Different types of surgery may be done depending on the cause of your problems.  HOME CARE INSTRUCTIONS   · Put ice on the affected area.  · Put ice in a plastic bag.  · Place a towel between your skin and the bag.  · Leave the ice on for 15-20 minutes,  03-04 times a day or as directed by your caregiver.  · If ice does not help, you can try using heat. Take a warm shower or bath, or use a hot water bottle as directed by your caregiver.  · You may try a gentle neck and shoulder massage.  · Use a flat pillow when you sleep.  · Only take over-the-counter or prescription medicines for pain, discomfort, or fever as directed by your caregiver.  · If physical therapy was prescribed, follow your caregiver's directions.  · If a soft collar was prescribed, use it as directed.  SEEK IMMEDIATE MEDICAL CARE IF:   · Your pain gets much worse and cannot be controlled with medicines.  · You have weakness or numbness in your hand, arm, face, or leg.  · You have a high fever or a stiff, rigid neck.  · You lose bowel or bladder control (incontinence).  · You have trouble with walking, balance, or speaking.  MAKE SURE YOU:   · Understand these instructions.  · Will watch your condition.  · Will get help right away if you are not doing well or get worse.  Document Released: 07/10/2001 Document Revised: 01/07/2012 Document Reviewed: 05/29/2011  [employer]® Patient Information ©2014 [employer].

## 2014-03-12 ENCOUNTER — Ambulatory Visit (INDEPENDENT_AMBULATORY_CARE_PROVIDER_SITE_OTHER): Payer: BC Managed Care – PPO | Admitting: *Deleted

## 2014-03-12 DIAGNOSIS — Z23 Encounter for immunization: Secondary | ICD-10-CM

## 2014-04-07 ENCOUNTER — Encounter (HOSPITAL_COMMUNITY): Payer: Self-pay | Admitting: Emergency Medicine

## 2014-04-07 DIAGNOSIS — R51 Headache: Secondary | ICD-10-CM | POA: Insufficient documentation

## 2014-04-07 DIAGNOSIS — H53149 Visual discomfort, unspecified: Secondary | ICD-10-CM | POA: Insufficient documentation

## 2014-04-07 DIAGNOSIS — R002 Palpitations: Secondary | ICD-10-CM | POA: Insufficient documentation

## 2014-04-07 DIAGNOSIS — Z8742 Personal history of other diseases of the female genital tract: Secondary | ICD-10-CM | POA: Insufficient documentation

## 2014-04-07 DIAGNOSIS — R21 Rash and other nonspecific skin eruption: Secondary | ICD-10-CM | POA: Insufficient documentation

## 2014-04-07 DIAGNOSIS — R112 Nausea with vomiting, unspecified: Secondary | ICD-10-CM | POA: Insufficient documentation

## 2014-04-07 NOTE — ED Notes (Signed)
Patient here with complaint of headache which began this morning. Gradually worsened throughout the day. Patient took ibuprofen without effect. Presents now do to 2 episodes of emesis. States headache history, but never been told she has migraines. Endorses photo sensitivity and auditory sensitivity, but no other abnormal neuro symptoms.

## 2014-04-08 ENCOUNTER — Emergency Department (HOSPITAL_COMMUNITY)
Admission: EM | Admit: 2014-04-08 | Discharge: 2014-04-08 | Disposition: A | Discharge status: Home / Self Care | Payer: BC Managed Care – PPO | Attending: Emergency Medicine | Admitting: Emergency Medicine

## 2014-04-08 DIAGNOSIS — R51 Headache: Secondary | ICD-10-CM

## 2014-04-08 DIAGNOSIS — R519 Headache, unspecified: Secondary | ICD-10-CM

## 2014-04-08 LAB — URINALYSIS, ROUTINE W REFLEX MICROSCOPIC
BILIRUBIN URINE: NEGATIVE
Glucose, UA: NEGATIVE mg/dL
HGB URINE DIPSTICK: NEGATIVE
KETONES UR: NEGATIVE mg/dL
Nitrite: NEGATIVE
PROTEIN: NEGATIVE mg/dL
Specific Gravity, Urine: 1.02 (ref 1.005–1.030)
UROBILINOGEN UA: 0.2 mg/dL (ref 0.0–1.0)
pH: 7.5 (ref 5.0–8.0)

## 2014-04-08 LAB — URINE MICROSCOPIC-ADD ON

## 2014-04-08 MED ORDER — DIPHENHYDRAMINE HCL 50 MG/ML IJ SOLN
12.5000 mg | Freq: Once | INTRAMUSCULAR | Status: AC
  Administered 2014-04-08: 12.5 mg via INTRAVENOUS
  Filled 2014-04-08: qty 1

## 2014-04-08 MED ORDER — IBUPROFEN 600 MG PO TABS: 600.0000 mg | ORAL_TABLET | Freq: Four times a day (QID) | ORAL | Status: DC | PRN

## 2014-04-08 MED ORDER — SODIUM CHLORIDE 0.9 % IV BOLUS (SEPSIS)
1000.0000 mL | Freq: Once | INTRAVENOUS | Status: AC
  Administered 2014-04-08: 1000 mL via INTRAVENOUS

## 2014-04-08 MED ORDER — KETOROLAC TROMETHAMINE 30 MG/ML IJ SOLN
30.0000 mg | Freq: Once | INTRAMUSCULAR | Status: AC
  Administered 2014-04-08: 30 mg via INTRAVENOUS
  Filled 2014-04-08: qty 1

## 2014-04-08 MED ORDER — ONDANSETRON 4 MG PO TBDP: 4.0000 mg | ORAL_TABLET | Freq: Three times a day (TID) | ORAL | Status: DC | PRN

## 2014-04-08 MED ORDER — METOCLOPRAMIDE HCL 5 MG/ML IJ SOLN
10.0000 mg | Freq: Once | INTRAMUSCULAR | Status: AC
  Administered 2014-04-08: 10 mg via INTRAVENOUS
  Filled 2014-04-08: qty 2

## 2014-04-08 NOTE — ED Provider Notes (Signed)
CSN: [id number]     Arrival date & time 04/07/14  2220 History   First MD Initiated Contact with Patient 04/08/14 0143     Chief Complaint  Patient presents with  . Headache  . Emesis     (Consider location/radiation/quality/duration/timing/severity/associated sxs/prior Treatment) HPI Comments: Woke with headache did not take Ibuprofen until 4 PM than had 2 episodes of vomiting Also reports facial/periorbital rash and palpitations.   Patient is a 33 y.o. female presenting with headaches and vomiting. The history is provided by the patient.  Headache Pain location:  Frontal Radiates to:  Does not radiate Severity currently:  9/10 Severity at highest:  5/10 Onset quality:  Gradual Duration:  12 hours Timing:  Constant Progression:  Worsening Chronicity:  New Similar to prior headaches: yes   Relieved by:  Nothing Worsened by:  Nothing tried Ineffective treatments:  NSAIDs Associated symptoms: nausea, photophobia and vomiting   Associated symptoms: no dizziness, no fever, no focal weakness, no neck pain, no neck stiffness, no numbness and no visual change   Emesis Associated symptoms: headaches   Associated symptoms: no chills     Past Medical History  Diagnosis Date  . Ovarian cyst   . No pertinent past medical history    Past Surgical History  Procedure Laterality Date  . Cesarean section    . Laparoscopic appendectomy  12/30/2011    Procedure: APPENDECTOMY LAPAROSCOPIC;  Surgeon: Wilmon ArmsMatthew K. Corliss Skainssuei, MD;  Location: [facility] OR;  Service: General;  Laterality: N/A;  . Appendectomy    . Cesarean section  05/09/2012    Procedure: CESAREAN SECTION;  Surgeon: Mickel Baasichard D Kaplan, MD;  Location: [facility];  Service: Gynecology;  Laterality: N/A;  Repeat Cesarean Section   Family History  Problem Relation Age of Onset  . Diabetes Mother   . Hypertension Mother    History  Substance Use Topics  . Smoking status: Never Smoker   . Smokeless tobacco: Not on file  . Alcohol Use: No   OB  History   Grav Para Term Preterm Abortions TAB SAB Ect Mult Living   2 2 2  0 0 0 0 0 0 2     Review of Systems  Constitutional: Negative for fever and chills.  Eyes: Positive for photophobia. Negative for visual disturbance.  Respiratory: Negative for chest tightness.   Cardiovascular: Positive for palpitations. Negative for chest pain.  Gastrointestinal: Positive for nausea and vomiting.  Musculoskeletal: Negative for neck pain and neck stiffness.  Skin: Positive for rash. Negative for wound.  Neurological: Positive for headaches. Negative for dizziness, focal weakness and numbness.  All other systems reviewed and are negative.     Allergies  Review of patient's allergies indicates no known allergies.  Home Medications   Prior to Admission medications   Medication Sig Start Date End Date Taking? Authorizing Provider  ibuprofen (ADVIL,MOTRIN) 200 MG tablet Take 200 mg by mouth every 6 (six) hours as needed for mild pain.   Yes Historical Provider, MD  IRON PO Take 1 tablet by mouth once.   Yes Historical Provider, MD  Multiple Vitamin (MULTIVITAMIN WITH MINERALS) TABS Take 1 tablet by mouth daily.   Yes Historical Provider, MD  ibuprofen (ADVIL,MOTRIN) 600 MG tablet Take 1 tablet (600 mg total) by mouth every 6 (six) hours as needed. 04/08/14   Arman FilterGail K Kade Rickels, NP  ondansetron (ZOFRAN ODT) 4 MG disintegrating tablet Take 1 tablet (4 mg total) by mouth every 8 (eight) hours as needed for nausea or vomiting. 04/08/14  Arman Filter, NP   BP 99/64  Pulse 83  Temp(Src) 98.2 F (36.8 C) (Oral)  Resp 19  Ht 5\' 5"  (1.651 m)  Wt 195 lb (88.451 kg)  BMI 32.45 kg/m2  SpO2 100%  LMP 03/22/2014 Physical Exam  ED Course  Procedures (including critical care time) Labs Review Labs Reviewed  URINALYSIS, ROUTINE W REFLEX MICROSCOPIC - Abnormal; Notable for the following:    APPearance CLOUDY (*)    Leukocytes, UA SMALL (*)    All other components within normal limits  URINE  MICROSCOPIC-ADD ON - Abnormal; Notable for the following:    Squamous Epithelial / LPF MANY (*)    Bacteria, UA FEW (*)    All other components within normal limits    Imaging Review No results found.   EKG Interpretation None      MDM  Patient rated her headache, 2/10 at time of discharge is been given a prescription for ibuprofen, and Zofran to help control his symptoms.  She's been instructed to followup with her primary care physician today, tomorrow, and the latest Final diagnoses:  Headache in front of head         Arman Filter, NP 04/08/14 0350

## 2014-04-08 NOTE — Discharge Instructions (Signed)

## 2014-04-09 NOTE — ED Provider Notes (Signed)
Medical screening examination/treatment/procedure(s) were performed by non-physician practitioner and as supervising physician I was immediately available for consultation/collaboration.   EKG Interpretation   Date/Time:  Thursday April 08 2014 02:20:05 EDT Ventricular Rate:  83 PR Interval:  153 QRS Duration: 94 QT Interval:  366 QTC Calculation: 430 R Axis:   73 Text Interpretation:  Sinus rhythm Low voltage, precordial leads Confirmed  by Malva CoganELOS  MD, Pinky Ravan ([id number]) on 04/09/2014 7:22:32 AM       Geoffery Lyonsouglas Macen Joslin, MD 04/09/14 [id number]

## 2014-08-04 ENCOUNTER — Ambulatory Visit (INDEPENDENT_AMBULATORY_CARE_PROVIDER_SITE_OTHER): Payer: BC Managed Care – PPO | Admitting: Family Medicine

## 2014-08-04 VITALS — BP 110/70 | HR 95 | Temp 98.3°F | Resp 18 | Ht 63.0 in | Wt 204.8 lb

## 2014-08-04 DIAGNOSIS — Z0000 Encounter for general adult medical examination without abnormal findings: Secondary | ICD-10-CM

## 2014-08-04 DIAGNOSIS — Z23 Encounter for immunization: Secondary | ICD-10-CM

## 2014-08-04 NOTE — Progress Notes (Signed)
[facility] [address] [place] [address] [id number] [phone]  Date:  08/04/2014   Name:  Destiny Barnes   DOB:  [birth year]   MRN:  [id number]  PCP:  Levi Aland, MD    Chief Complaint: Annual Exam and Immunizations   History of Present Illness:  Destiny Barnes is a 33 y.o. very pleasant female patient who presents with the following:  She is here today for forms for college- [employer].  She plans to study nursing.  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  Today she just need a flu shot and the rest of her form completed.   She is generally in good health No problems with reasonable physical activity   Patient Active Problem List   Diagnosis Date Noted  . Acute appendicitis 12/30/2011  . Pregnancy test positive for other normal pregnancy 12/30/2011  . Ovarian cyst     Past Medical History  Diagnosis Date  . Ovarian cyst   . No pertinent past medical history     Past Surgical History  Procedure Laterality Date  . Cesarean section    . Laparoscopic appendectomy  12/30/2011    Procedure: APPENDECTOMY LAPAROSCOPIC;  Surgeon: Wilmon Arms. Corliss Skains, MD;  Location: [facility] OR;  Service: General;  Laterality: N/A;  . Appendectomy    . Cesarean section  05/09/2012    Procedure: CESAREAN SECTION;  Surgeon: Mickel Baas, MD;  Location: [facility];  Service: Gynecology;  Laterality: N/A;  Repeat Cesarean Section    History  Substance Use Topics  . Smoking status: Never Smoker   . Smokeless tobacco: Not on file  . Alcohol Use: No    Family History  Problem Relation Age of Onset  . Diabetes Mother   . Hypertension Mother     No Known Allergies  Medication list has been reviewed and updated.  Current Outpatient Prescriptions on File Prior to Visit  Medication Sig Dispense Refill  . Multiple Vitamin (MULTIVITAMIN WITH MINERALS) TABS Take 1 tablet by mouth daily.       No current  facility-administered medications on file prior to visit.    Review of Systems:  As per HPI- otherwise negative.   Physical Examination: Filed Vitals:   08/04/14 0924  BP: 110/70  Pulse: 95  Temp: 98.3 F (36.8 C)  Resp: 18   Filed Vitals:   08/04/14 0924  Height: 5\' 3"  (1.6 m)  Weight: 204 lb 12.8 oz (92.897 kg)   Body mass index is 36.29 kg/(m^2). Ideal Body Weight: Weight in (lb) to have BMI = 25: 140.8  GEN: WDWN, NAD, Non-toxic, A & O x 3 HEENT: Atraumatic, Normocephalic. Neck supple. No masses, No LAD. Ears and Nose: No external deformity. CV: RRR, No M/G/R. No JVD. No thrill. No extra heart sounds. PULM: CTA B, no wheezes, crackles, rhonchi. No retractions. No resp. distress. No accessory muscle use. ABD: S, NT, ND EXTR: No c/c/e NEURO Normal gait.  PSYCH: Normally interactive. Conversant. Not depressed or anxious appearing.  Calm demeanor.    Assessment and Plan: Needs flu shot - Plan: Flu Vaccine QUAD 36+ mos IM  Physical exam  Completed forms for [employer].  Gave flu shot, she is otherwise UTD>  She is not sure if she needs another TB screening today.  If she does need a quantiferon gold I will order this for her as a lab only; she will let  me know  Signed Abbe AmsterdamJessica Cliff Damiani, MD

## 2014-08-04 NOTE — Patient Instructions (Addendum)
Send me a message on mychart if you do need to have any other labs or TB testing.    Influenza Vaccine (Flu Vaccine, Inactivated or Recombinant) 2014-2015: What You Need to Know 1. Why get vaccinated? Influenza ("flu") is a contagious disease that spreads around the [place] every winter, usually between October and May. Flu is caused by influenza viruses, and is spread mainly by coughing, sneezing, and close contact. Anyone can get flu, but the risk of getting flu is highest among children. Symptoms come on suddenly and may last several days. They can include:  fever/chills  sore throat  muscle aches  fatigue  cough  headache  runny or stuffy nose Flu can make some people much sicker than others. These people include young children, people 33 and older, pregnant women, and people with certain health conditions-such as heart, lung or kidney disease, nervous system disorders, or a weakened immune system. Flu vaccination is especially important for these people, and anyone in close contact with them. Flu can also lead to pneumonia, and make existing medical conditions worse. It can cause diarrhea and seizures in children. Each year thousands of people in the [place] die from flu, and many more are hospitalized. Flu vaccine is the best protection against flu and its complications. Flu vaccine also helps prevent spreading flu from person to person. 2. Inactivated and recombinant flu vaccines You are getting an injectable flu vaccine, which is either an "inactivated" or "recombinant" vaccine. These vaccines do not contain any live influenza virus. They are given by injection with a needle, and often called the "flu shot."  A different live, attenuated (weakened) influenza vaccine is sprayed into the nostrils. This vaccine is described in a separate Vaccine Information Statement. Flu vaccination is recommended every year. Some children 6 months through 33 years of age might need two  doses during one year. Flu viruses are always changing. Each year's flu vaccine is made to protect against 3 or 4 viruses that are likely to cause disease that year. Flu vaccine cannot prevent all cases of flu, but it is the best defense against the disease.  It takes about 2 weeks for protection to develop after the vaccination, and protection lasts several months to a year. Some illnesses that are not caused by influenza virus are often mistaken for flu. Flu vaccine will not prevent these illnesses. It can only prevent influenza. Some inactivated flu vaccine contains a very small amount of a mercury-based preservative called thimerosal. Studies have shown that thimerosal in vaccines is not harmful, but flu vaccines that do not contain a preservative are available. 3. Some people should not get this vaccine Tell the person who gives you the vaccine:  If you have any severe, life-threatening allergies. If you ever had a life-threatening allergic reaction after a dose of flu vaccine, or have a severe allergy to any part of this vaccine, including (for example) an allergy to gelatin, antibiotics, or eggs, you may be advised not to get vaccinated. Most, but not all, types of flu vaccine contain a small amount of egg protein.  If you ever had Guillain-Barr Syndrome (a severe paralyzing illness, also called GBS). Some people with a history of GBS should not get this vaccine. This should be discussed with your doctor.  If you are not feeling well. It is usually okay to get flu vaccine when you have a mild illness, but you might be advised to wait until you feel better. You should come back when  you are better. 4. Risks of a vaccine reaction With a vaccine, like any medicine, there is a chance of side effects. These are usually mild and go away on their own. Problems that could happen after any vaccine:  Brief fainting spells can happen after any medical procedure, including vaccination. Sitting or lying  down for about 15 minutes can help prevent fainting, and injuries caused by a fall. Tell your doctor if you feel dizzy, or have vision changes or ringing in the ears.  Severe shoulder pain and reduced range of motion in the arm where a shot was given can happen, very rarely, after a vaccination.  Severe allergic reactions from a vaccine are very rare, estimated at less than 1 in a million doses. If one were to occur, it would usually be within a few minutes to a few hours after the vaccination. Mild problems following inactivated flu vaccine:  soreness, redness, or swelling where the shot was given  hoarseness  sore, red or itchy eyes  cough  fever  aches  headache  itching  fatigue If these problems occur, they usually begin soon after the shot and last 1 or 2 days. Moderate problems following inactivated flu vaccine:  Young children who get inactivated flu vaccine and pneumococcal vaccine (PCV13) at the same time may be at increased risk for seizures caused by fever. Ask your doctor for more information. Tell your doctor if a child who is getting flu vaccine has ever had a seizure. Inactivated flu vaccine does not contain live flu virus, so you cannot get the flu from this vaccine. As with any medicine, there is a very remote chance of a vaccine causing a serious injury or death. The safety of vaccines is always being monitored. For more information, visit: http://floyd.org/www.cdc.gov/vaccinesafety/ 5. What if there is a serious reaction? What should I look for?  Look for anything that concerns you, such as signs of a severe allergic reaction, very high fever, or behavior changes. Signs of a severe allergic reaction can include hives, swelling of the face and throat, difficulty breathing, a fast heartbeat, dizziness, and weakness. These would start a few minutes to a few hours after the vaccination. What should I do?  If you think it is a severe allergic reaction or other emergency that can't  wait, call 9-1-1 and get the person to the nearest hospital. Otherwise, call your doctor.  Afterward, the reaction should be reported to the Vaccine Adverse Event Reporting System (VAERS). Your doctor should file this report, or you can do it yourself through the VAERS web site at www.vaers.LAgents.nohhs.gov, or by calling 1-437-020-4878. VAERS does not give medical advice. 6. The National Vaccine Injury Compensation Program The [employer] Vaccine Injury Compensation Program (VICP) is a federal program that was created to compensate people who may have been injured by certain vaccines. Persons who believe they may have been injured by a vaccine can learn about the program and about filing a claim by calling 1-801-706-1244 or visiting the VICP website at SpiritualWord.atwww.hrsa.gov/vaccinecompensation. There is a time limit to file a claim for compensation. 7. How can I learn more?  Ask your health care provider.  Call your local or state health department.  Contact the Centers for Disease Control and Prevention (CDC):  Call 612-532-96871-(754)553-3713 (1-800-CDC-INFO) or  Visit CDC's website at BiotechRoom.com.cywww.cdc.gov/flu CDC Vaccine Information Statement (Interim) Inactivated Influenza Vaccine (06/16/2013) Document Released: 08/09/2006 Document Revised: 03/01/2014 Document Reviewed: 10/02/2013 [facility] Patient Information 2015 [place], [employer]. This information is not intended to  replace advice given to you by your health care provider. Make sure you discuss any questions you have with your health care provider.  

## 2014-08-20 ENCOUNTER — Ambulatory Visit (INDEPENDENT_AMBULATORY_CARE_PROVIDER_SITE_OTHER): Payer: BC Managed Care – PPO | Admitting: Physician Assistant

## 2014-08-20 VITALS — BP 104/60 | HR 66 | Temp 98.4°F | Resp 16 | Ht 63.0 in | Wt 204.0 lb

## 2014-08-20 DIAGNOSIS — Z111 Encounter for screening for respiratory tuberculosis: Secondary | ICD-10-CM

## 2014-08-20 NOTE — Progress Notes (Signed)

## 2014-08-20 NOTE — Progress Notes (Signed)
I was directly involved with the patient's care and agree with the physical, diagnosis and treatment plan.  

## 2014-08-20 NOTE — Progress Notes (Signed)
     IDENTIFYING INFORMATION  Destiny Barnes / DOB: 04/04/1981 / MRN: [id number]  The patient  does not have any active problems on file.  SUBJECTIVE  Chief Complaint: Immunizations   History of present illness: Destiny Barnes is a 33 y.o. year old female who presents for a TB test today. 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  She does not have any risk factors for TB, and did not have any positives on her TB risk questionnaire. She would like the quantifier test.         She  has a past medical history of Ovarian cyst; No pertinent past medical history; Acute appendicitis (12/30/2011); and Ovarian cyst..  The patient has a current medication list which includes the following prescription(s): multivitamin with minerals..  Destiny Barnes has No Known Allergies.. She  reports that she has never smoked. She does not have any smokeless tobacco history on file. She reports that she does not drink alcohol or use illicit drugs. and she  reports that she currently engages in sexual activity. She reports using the following method of birth control/protection: None.  The patient  has past surgical history that includes Cesarean section; laparoscopic appendectomy (12/30/2011); Appendectomy; and Cesarean section (05/09/2012).Marland Kitchen.  Her family history includes Diabetes in her mother; Hypertension in her mother; Pancreatic cancer in her father.  Review of Systems  Constitutional: Negative for fever, chills, weight loss, malaise/fatigue and diaphoresis.  Respiratory: Negative for cough, hemoptysis, sputum production, shortness of breath and wheezing.   Skin: Negative for itching and rash.  Neurological: Negative for weakness and headaches.    OBJECTIVE  Blood pressure 104/60, pulse 66, temperature 98.4 F (36.9 C), temperature source Oral, resp. rate 16, height 5\' 3"  (1.6 m), weight 204 lb (92.534 kg), last menstrual period 07/11/2014, SpO2 100.00%. The patient's body mass index is 36.15 kg/(m^2).  Physical  Exam  Constitutional: She is oriented to person, place, and time and well-developed, well-nourished, and in no distress.  HENT:  Head: Normocephalic.  Eyes: EOM are normal. Pupils are equal, round, and reactive to light.  Neck: Normal range of motion.  Cardiovascular: Regular rhythm.   Pulmonary/Chest: Effort normal.  Musculoskeletal: Normal range of motion.  Neurological: She is alert and oriented to person, place, and time.  Skin: Skin is warm and dry. She is not diaphoretic.  Psychiatric: Mood, memory, affect and judgment normal.    No results found for this or any previous visit (from the past 24 hour(s)).  ASSESSMENT & PLAN  Wilkie AyeZakia was seen today for immunizations.  Diagnoses and associated orders for this visit:  Screening for tuberculosis: Patient would like to have her results released to mychart once available.   - Quantiferon tb gold assay     The patient was instructed to to call or comeback to clinic as needed, or should symptoms warrant.  Deliah BostonMichael Kazzandra Desaulniers, MHS, PA-C [facility] 08/20/2014 9:51 AM

## 2014-08-24 LAB — QUANTIFERON TB GOLD ASSAY (BLOOD)
INTERFERON GAMMA RELEASE ASSAY: NEGATIVE
Mitogen value: >10 IU/mL
QUANTIFERON NIL VALUE: 0.23 [IU]/mL
QUANTIFERON TB AG MINUS NIL: 0 [IU]/mL
TB AG VALUE: 0.21 [IU]/mL

## 2014-09-23 IMAGING — CR DG CERVICAL SPINE COMPLETE 4+V
5 series · 5 of 5 positions shown · non-contrast
Comparison: None.

CLINICAL DATA: One week history of left-sided neck discomfort, no
history of trauma

EXAM:
CERVICAL SPINE  4+ VIEWS

[lpo]
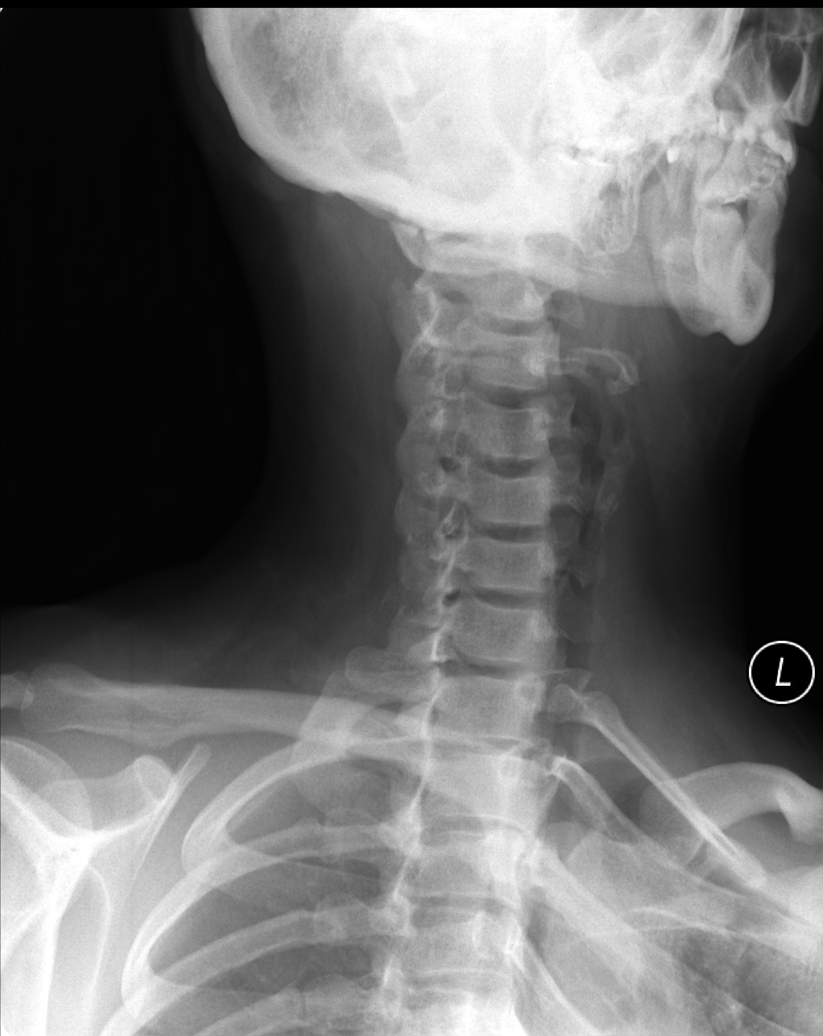

[lateral]
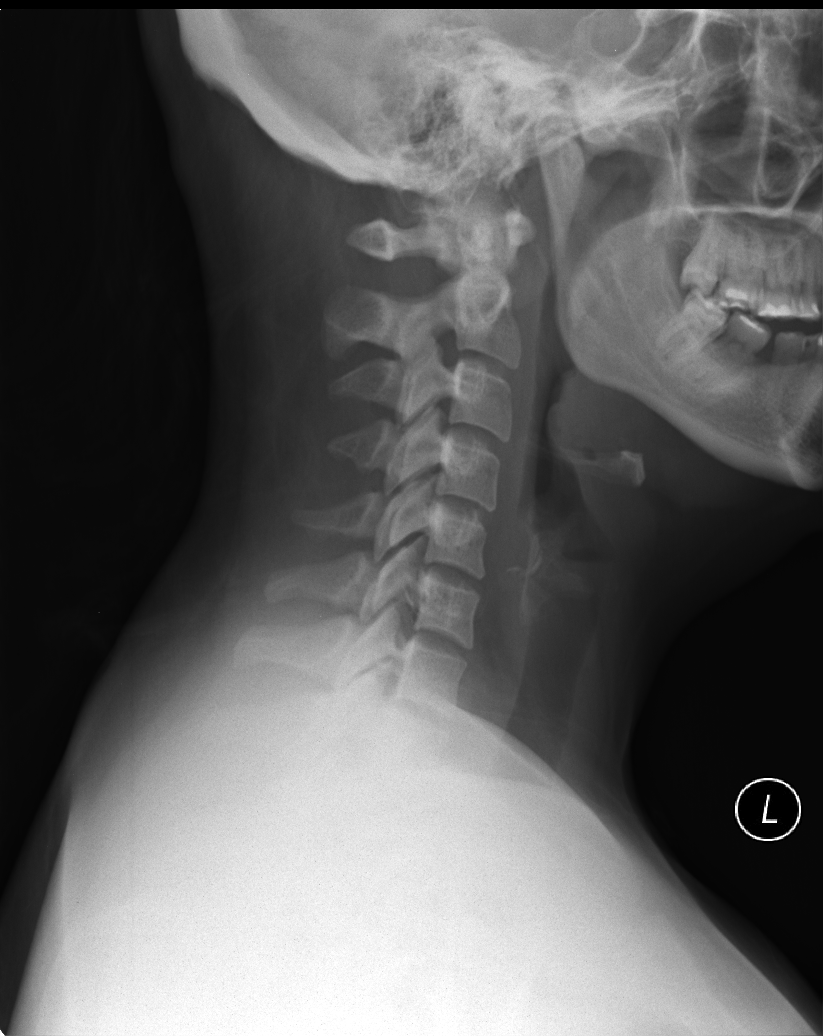

[rpo]
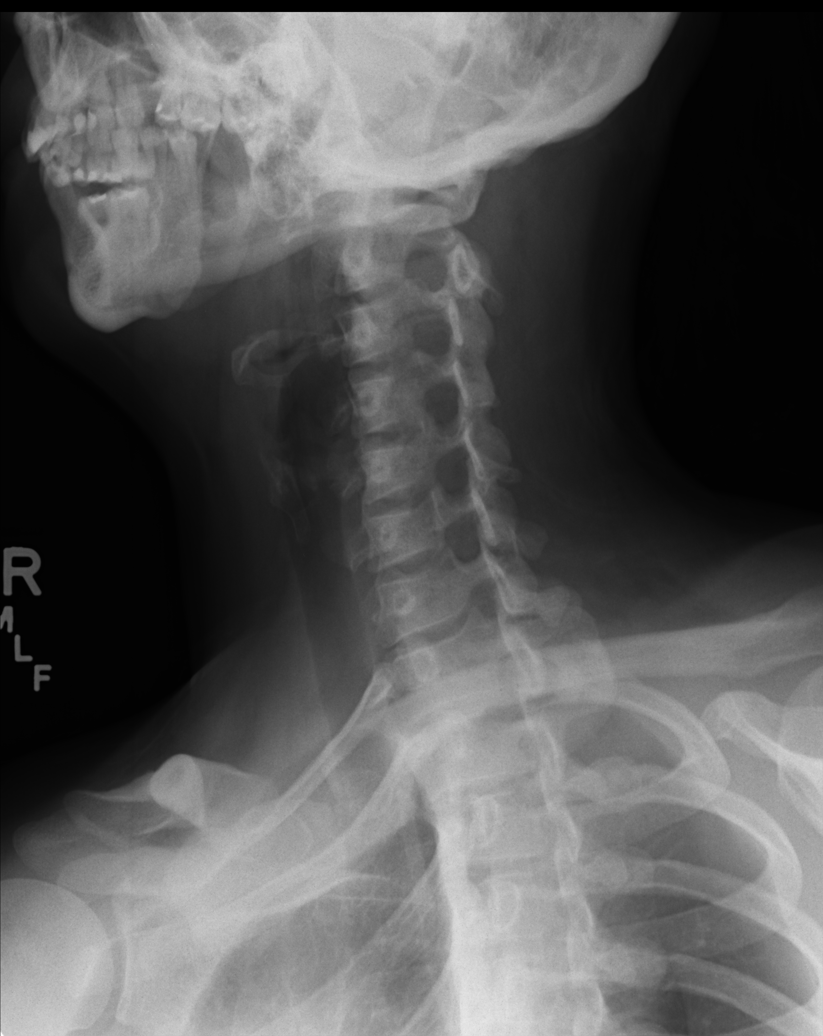

[AP]
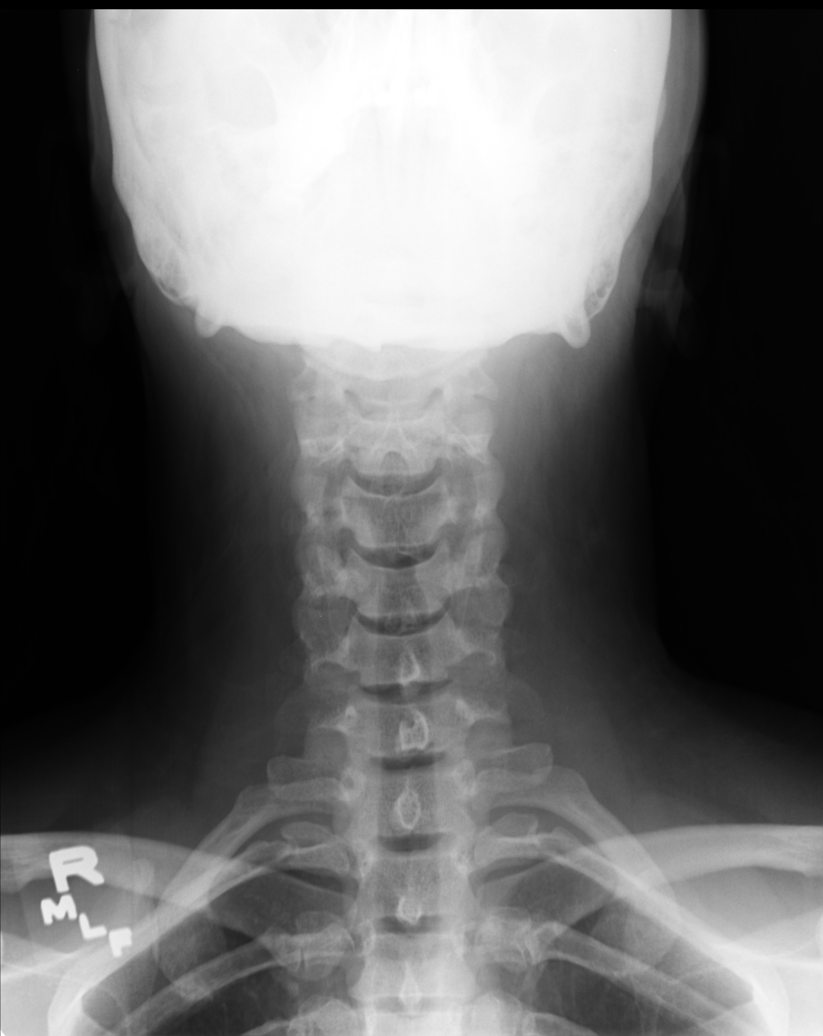

[swimmers]
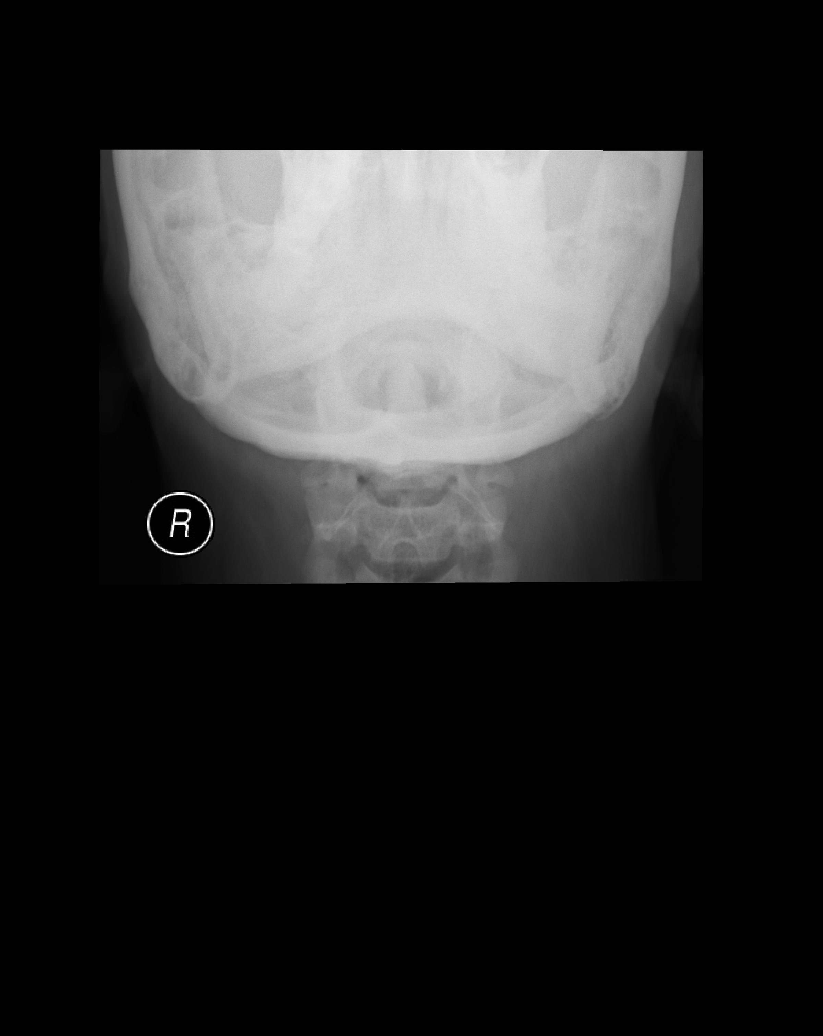

[5 of 5 positions shown; findings below may reference images not displayed]

FINDINGS: The cervical vertebral bodies are preserved in height. There is mild
loss of the normal cervical lordosis. The intervertebral disc space
heights are well maintained. There is no evidence of a perched
facet. The spinous processes appear normal. The prevertebral soft
tissues are normal in appearance. The oblique views reveal no bony
encroachment upon the neural foramina on the left. On the right the
oblique view is limited in positioning. The odontoid is intact. The
pulmonary apices appear clear. The observed portions of the first
and second ribs appear normal.
IMPRESSION: There is no evidence of acute bony abnormality of the cervical
spine. Mild loss of the normal cervical lordosis may reflect muscle
spasm.

## 2015-04-30 ENCOUNTER — Ambulatory Visit (INDEPENDENT_AMBULATORY_CARE_PROVIDER_SITE_OTHER): Payer: BLUE CROSS/BLUE SHIELD | Admitting: Urgent Care

## 2015-04-30 VITALS — BP 106/68 | HR 84 | Temp 98.6°F | Resp 16 | Ht 63.0 in | Wt 201.0 lb

## 2015-04-30 DIAGNOSIS — H5712 Ocular pain, left eye: Secondary | ICD-10-CM

## 2015-04-30 DIAGNOSIS — H00016 Hordeolum externum left eye, unspecified eyelid: Secondary | ICD-10-CM

## 2015-04-30 MED ORDER — CEPHALEXIN 500 MG PO CAPS: 500.0000 mg | ORAL_CAPSULE | Freq: Three times a day (TID) | ORAL | Status: AC

## 2015-04-30 NOTE — Patient Instructions (Addendum)
-   For pain and inflammation, please use ibuprofen 400-600mg  (2-3 pills) every 6-8 hours. - Apply warm compresses for 10-15 minutes with good pressure at least 3-4 times daily.  Sty A sty (hordeolum) is an infection of a gland in the eyelid located at the base of the eyelash. A sty may develop a white or yellow head of pus. It can be puffy (swollen). Usually, the sty will burst and pus will come out on its own. They do not leave lumps in the eyelid once they drain. A sty is often confused with another form of cyst of the eyelid called a chalazion. Chalazions occur within the eyelid and not on the edge where the bases of the eyelashes are. They often are red, sore and then form firm lumps in the eyelid. CAUSES   Germs (bacteria).  Lasting (chronic) eyelid inflammation. SYMPTOMS   Tenderness, redness and swelling along the edge of the eyelid at the base of the eyelashes.  Sometimes, there is a white or yellow head of pus. It may or may not drain. DIAGNOSIS  An ophthalmologist will be able to distinguish between a sty and a chalazion and treat the condition appropriately.  TREATMENT   Styes are typically treated with warm packs (compresses) until drainage occurs.  In rare cases, medicines that kill germs (antibiotics) may be prescribed. These antibiotics may be in the form of drops, cream or pills.  If a hard lump has formed, it is generally necessary to do a small incision and remove the hardened contents of the cyst in a minor surgical procedure done in the office.  In suspicious cases, your caregiver may send the contents of the cyst to the lab to be certain that it is not a rare, but dangerous form of cancer of the glands of the eyelid. HOME CARE INSTRUCTIONS   Wash your hands often and dry them with a clean towel. Avoid touching your eyelid. This may spread the infection to other parts of the eye.  Apply heat to your eyelid for 10 to 20 minutes, several times a day, to ease pain and  help to heal it faster.  Do not squeeze the sty. Allow it to drain on its own. Wash your eyelid carefully 3 to 4 times per day to remove any pus. SEEK IMMEDIATE MEDICAL CARE IF:   Your eye becomes painful or puffy (swollen).  Your vision changes.  Your sty does not drain by itself within 3 days.  Your sty comes back within a short period of time, even with treatment.  You have redness (inflammation) around the eye.  You have a fever. Document Released: 07/25/2005 Document Revised: 01/07/2012 Document Reviewed: 01/29/2014 [facility] Patient Information 2015 [place]ExitCare, [place]. This information is not intended to replace advice given to you by your health care provider. Make sure you discuss any questions you have with your health care provider.

## 2015-04-30 NOTE — Progress Notes (Signed)
    MRN: [id number] DOB: [birth year]  Subjective:   Destiny Barnes is a 34 y.o. female presenting for chief complaint of Eye Pain  Reports 7 month history of left eye mass, 2 week history of pain over eye mass. Has tried warm compresses but is limited to due to pain. Of note, patient had an eye mass in her left eye, very similar to what she now has except that problem was resolved with warm compresses. Denies fevers, eye pain, photosensitivity, eye discharge, red eyes, sinus pain, tooth pain, ear pain, ear drainage. Denies any other aggravating or relieving factors, no other questions or concerns.  Destiny Barnes has a current medication list which includes the following prescription(s): ibuprofen and multivitamin with minerals. She has No Known Allergies.  Destiny Barnes  has a past medical history of Ovarian cyst; No pertinent past medical history; Acute appendicitis (12/30/2011); and Ovarian cyst. Also  has past surgical history that includes Cesarean section; laparoscopic appendectomy (12/30/2011); Appendectomy; and Cesarean section (05/09/2012).  ROS As in subjective.  Objective:   Vitals: BP 106/68 mmHg  Pulse 84  Temp(Src) 98.6 F (37 C)  Resp 16  Ht 5\' 3"  (1.6 m)  Wt 201 lb (91.173 kg)  BMI 35.61 kg/m2  SpO2 98%  LMP 04/26/2015  Physical Exam  Constitutional: She is oriented to person, place, and time. She appears well-developed and well-nourished.  Eyes: Pupils are equal, round, and reactive to light. Right eye exhibits no chemosis, no discharge, no exudate and no hordeolum. No foreign body present in the right eye. Left eye exhibits hordeolum (erythematous, exquisitely painful, no drainage expressed). Left eye exhibits no chemosis, no discharge and no exudate. No foreign body present in the left eye. Right conjunctiva is not injected. Right conjunctiva has no hemorrhage. Left conjunctiva is not injected. Left conjunctiva has no hemorrhage. Right eye exhibits normal extraocular motion and no nystagmus.  Left eye exhibits normal extraocular motion and no nystagmus.  Cardiovascular: Normal rate.   Pulmonary/Chest: Effort normal.  Neurological: She is alert and oriented to person, place, and time.  Skin: Skin is warm and dry. No rash noted. No erythema. No pallor.   Assessment and Plan :   1. Hordeolum externum, left 2. Eye pain, left - Will start oral antibiotic, advised warm compresses, if no improvement in symptoms with oral antibiotic, will try ophthalmic topical antibiotic.  Wallis BambergMario Hilde Churchman, PA-C [facility] [phone] 04/30/2015 11:40 AM

## 2015-05-03 ENCOUNTER — Telehealth: Payer: Self-pay

## 2015-05-03 DIAGNOSIS — H00016 Hordeolum externum left eye, unspecified eyelid: Secondary | ICD-10-CM

## 2015-05-03 NOTE — Telephone Encounter (Signed)
Pt saw manio on Saturday and he said to call back if not any better

## 2015-05-04 MED ORDER — POLYMYXIN B-TRIMETHOPRIM 10000-0.1 UNIT/ML-% OP SOLN: 1.0000 [drp] | OPHTHALMIC | Status: AC

## 2015-05-04 NOTE — Telephone Encounter (Signed)
[facility], he wanted to try a topical antibiotic if symptoms were not improving. Pt states there has been no change in her symptoms. She has been using warm compresses frequently. Still will pain in her eyelid. Will add polytrim drops. If not getting better in 1 week, return to clinic.

## 2015-05-04 NOTE — Telephone Encounter (Signed)
Assessment and Plan :   1. Hordeolum externum, left 2. Eye pain, left - Will start oral antibiotic, advised warm compresses, if no improvement in symptoms with oral antibiotic, will try ophthalmic topical antibiotic.  Destiny BambergMario Mani, PA-C [facility] [phone] 04/30/2015 11:40 AM       Destiny PalmsMario not here today.

## 2016-05-28 ENCOUNTER — Emergency Department
Admission: EM | Admit: 2016-05-28 | Discharge: 2016-05-28 | Discharge status: Absent without leave | Payer: Medicaid Other
